# Patient Record
Sex: Female | Born: 1980 | Race: Black or African American | Hispanic: No | Marital: Single | Smoking: Current every day smoker
Health system: Southern US, Community
[De-identification: ages and names within clinical notes are randomized; demographics above are authoritative.]

---

## 2005-03-22 ENCOUNTER — Emergency Department (HOSPITAL_COMMUNITY): Admission: EM | Admit: 2005-03-22 | Discharge: 2005-03-22 | Payer: Self-pay | Admitting: Emergency Medicine

## 2005-11-13 ENCOUNTER — Emergency Department (HOSPITAL_COMMUNITY): Admission: EM | Admit: 2005-11-13 | Discharge: 2005-11-13 | Payer: Self-pay | Admitting: Emergency Medicine

## 2005-11-14 ENCOUNTER — Inpatient Hospital Stay (HOSPITAL_COMMUNITY): Admission: EM | Admit: 2005-11-14 | Discharge: 2005-11-15 | Payer: Self-pay | Admitting: Urology

## 2007-02-03 IMAGING — CT CT PELVIS W/O CM
2 of 4 series · 14 of 32 positions shown, 19 images · IV contrast (agent unspecified)
Comparison: none

CLINICAL DATA: Right-sided abdominal pain extending into the right lower quadrant.  Hematuria.  
 ABDOMEN CT WITHOUT CONTRAST:
TECHNIQUE: Multidetector CT imaging of the abdomen was performed following the standard protocol without IV contrast.  
 The scan demonstrates mild right hydronephrosis.  The right ureter is dilated into the pelvis.  There are no renal calculi.  The liver, spleen, pancreas, and adrenal glands appear normal.  No dilated loops of bowel or adenopathy.
TECHNIQUE: Multidetector CT imaging of the pelvis was performed following the standard protocol without IV contrast. 
 There is a 4.3 mm calcification in the right side of the pelvis consistent with a distal right ureteral stone.  The ureter is dilated to the level of the stone.  The stone is 2 to 3 cm proximal to the ureterovesical junction.  The appendix is visible and appears normal.  Uterus is not enlarged.  The ovaries are only faintly defined.

[Series 2: routine abdomen · axial · 0.73mm/px · z∈[-433,-133]mm · 6 of 86 slices shown, 11 images]
[im 13/86  soft-tissue]
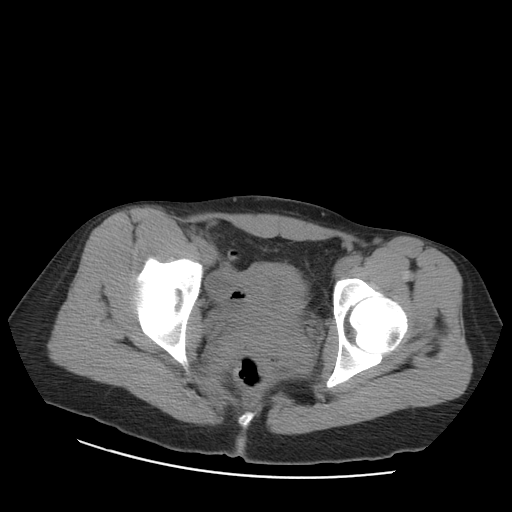
[im 13/86  bone]
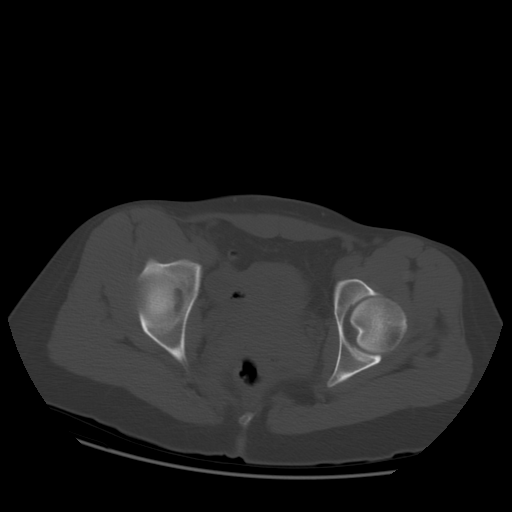
[im 25/86  soft-tissue]
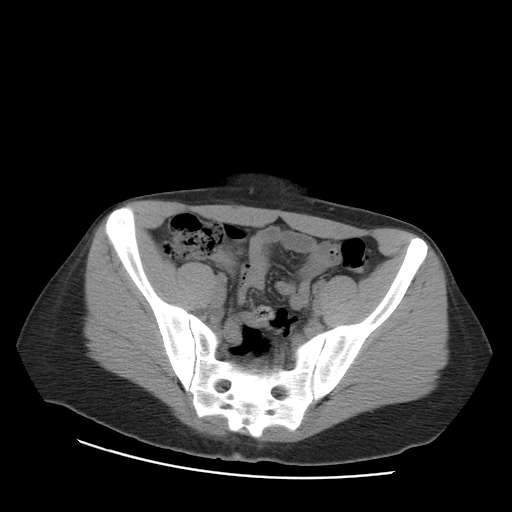
[im 37/86  soft-tissue]
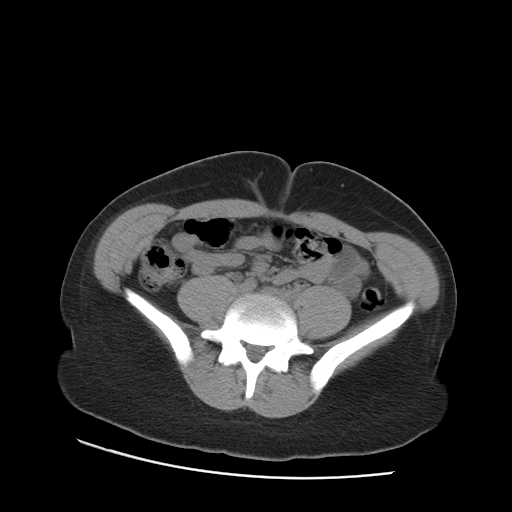
[im 37/86  lung]
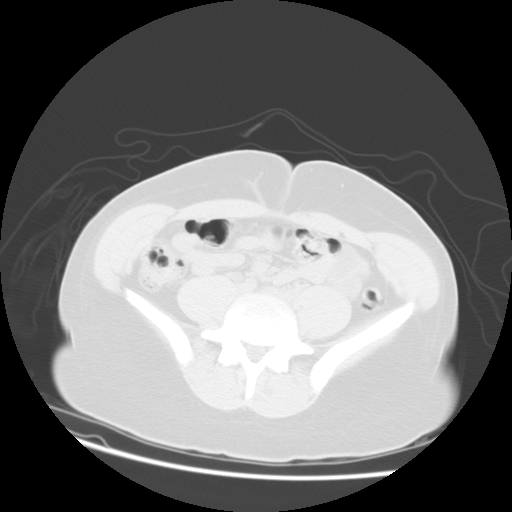
[im 49/86  soft-tissue]
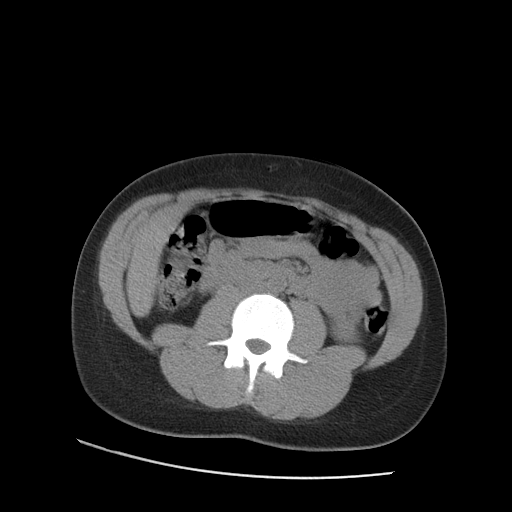
[im 49/86  lung]
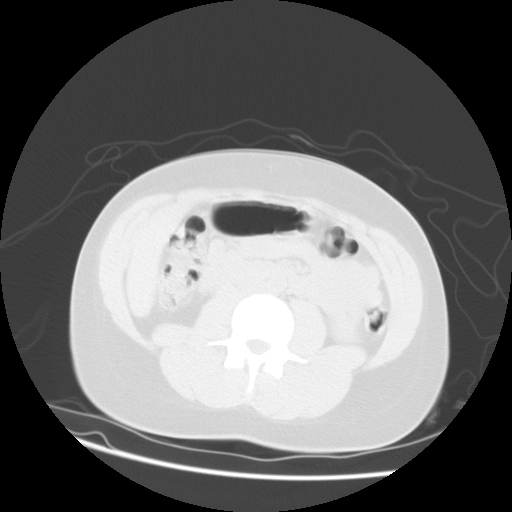
[im 61/86  soft-tissue]
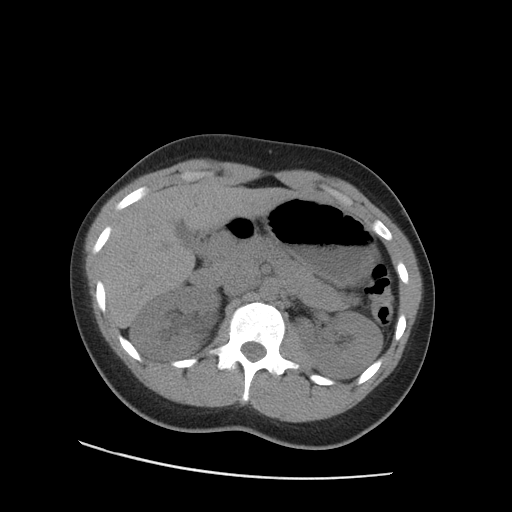
[im 61/86  lung]
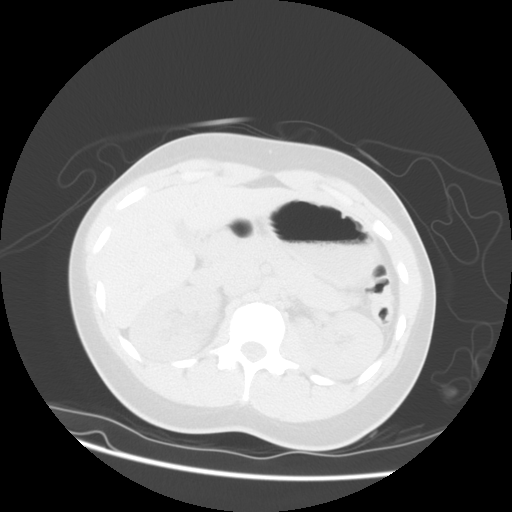
[im 73/86  soft-tissue]
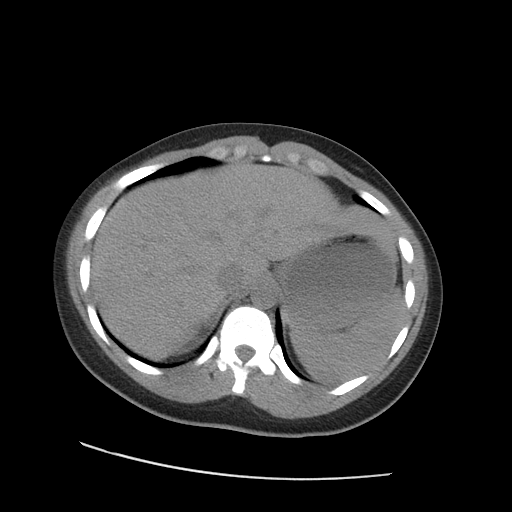
[im 73/86  lung]
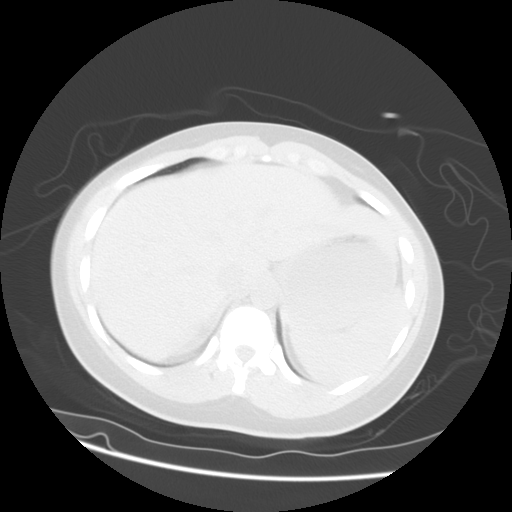

[Series 103: reformatted · sagittal · 0.88mm/px · 8 of 133 slices shown]
[im 13/133  soft-tissue]
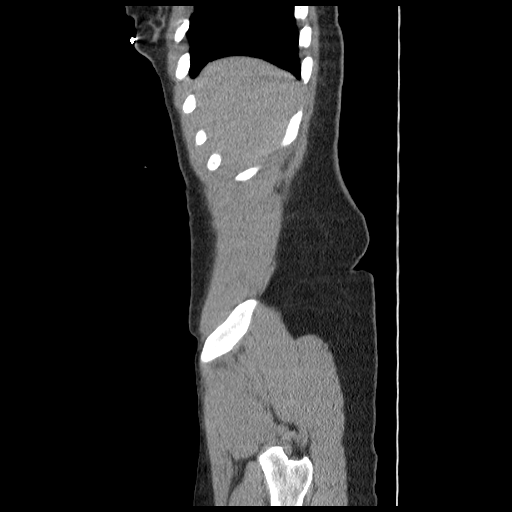
[im 25/133  soft-tissue]
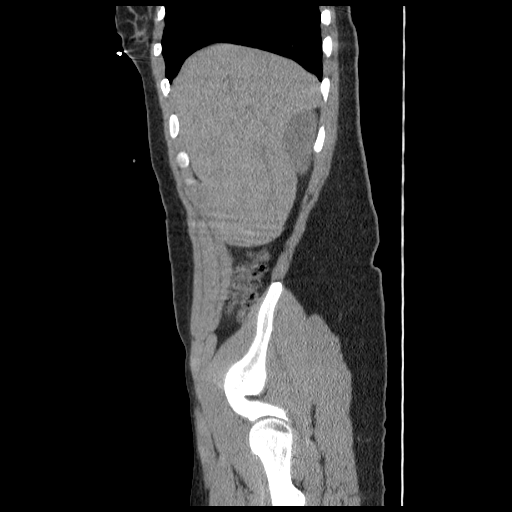
[im 49/133  soft-tissue]
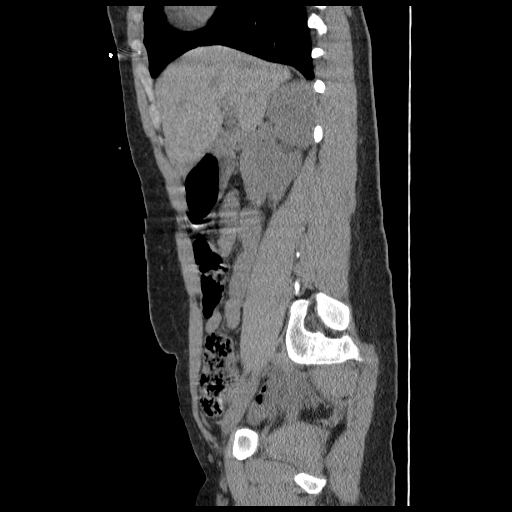
[im 61/133  soft-tissue]
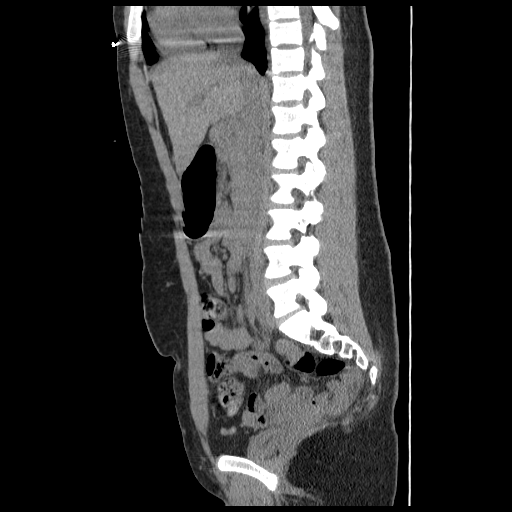
[im 73/133  soft-tissue]
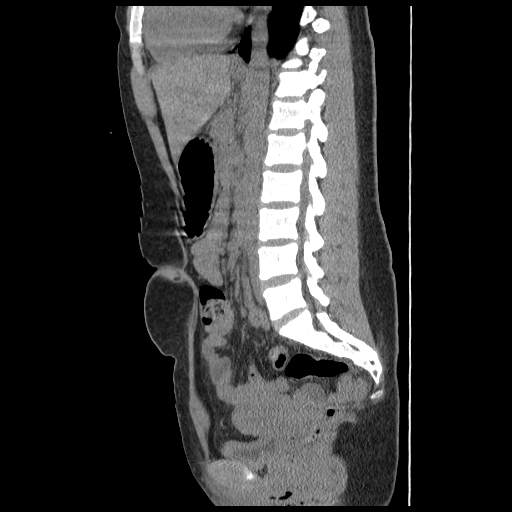
[im 85/133  soft-tissue]
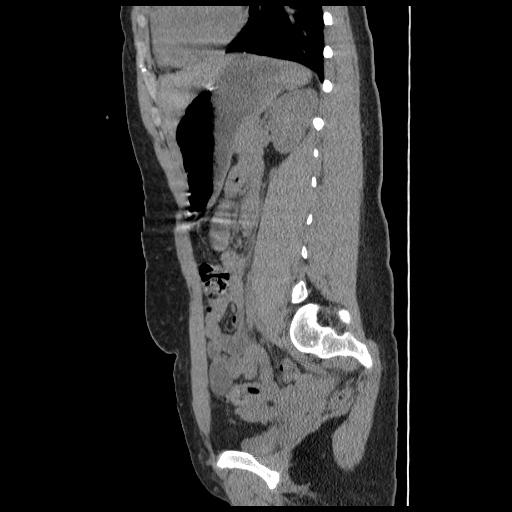
[im 109/133  soft-tissue]
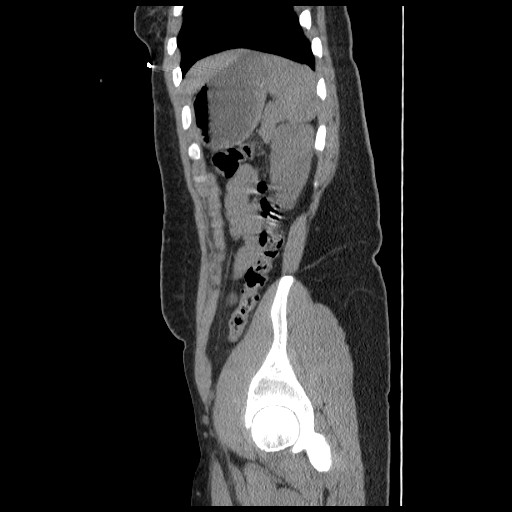
[im 121/133  soft-tissue]
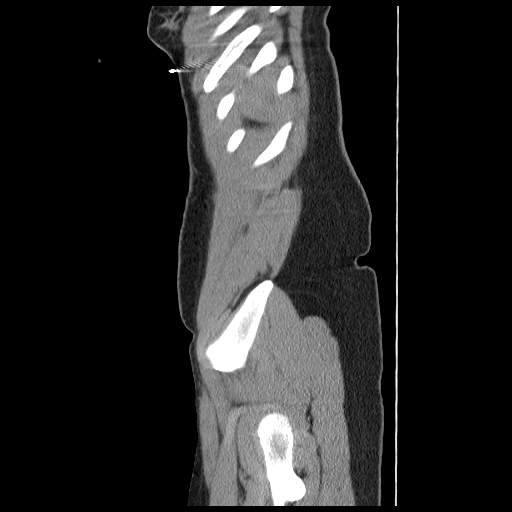

[14 of 32 positions shown; findings below may reference images not displayed]

IMPRESSION: Mild right hydronephrosis.  
 PELVIS CT WITHOUT CONTRAST:
IMPRESSION: 1.  4.3 mm stone in the distal right ureter causing mild right hydronephrosis. 
 2.  The stone is visible on the scout radiograph and if required, a followup could be performed with plain film exam.

## 2008-04-01 ENCOUNTER — Emergency Department (HOSPITAL_COMMUNITY): Admission: EM | Admit: 2008-04-01 | Discharge: 2008-04-01 | Payer: Self-pay | Admitting: Emergency Medicine

## 2011-04-04 NOTE — Op Note (Signed)
Tasha Hancock, Tasha Hancock                  ACCOUNT NO.:  [id number]   MEDICAL RECORD NO.:  [id number]          PATIENT TYPE:  INP   LOCATION:  1505                         FACILITY:  [facility]   PHYSICIAN:  Lindaann Slough, M.D.  DATE OF BIRTH:  [birth year]   DATE OF PROCEDURE:  11/15/2005  DATE OF DISCHARGE:  11/15/2005                                 OPERATIVE REPORT   PREOPERATIVE DIAGNOSIS:  Right ureteral stone.   POSTOPERATIVE DIAGNOSIS:  Right ureteral stone.   PROCEDURE:  Cystoscopy, right retrograde pyelogram,  ureteroscopy, holmium  laser, right ureteral stone extraction and insertion of double-J catheter.   SURGEON:  Danae Chen, M.D.   ANESTHESIA:  General.   INDICATION:  The patient is a 30 year old female who has been complaining of  severe right flank pain associated with nausea and vomiting.  She has been  having difficulty voiding. A CT scan of the abdomen and pelvis showed a 5-mm  stone in the right distal ureter.  She was treated with analgesics and did  not have any relief.  She is scheduled for stone manipulation.   DESCRIPTION OF PROCEDURE:  Under general anesthesia, the patient was prepped  and draped and placed in the dorsal lithotomy position. A #22 Wappler  cystoscope was inserted into the bladder. The bladder mucosa is normal.  There is no stone or tumor in the bladder. The ureteral orifices are in  normal position and shape.   RETROGRADE PYELOGRAM:   A cone-tipcatheter was passed through the cystoscope into the right ureteral  orifice. Contrast was injected through the cone-tip catheter. There was a  filling defect in the distal ureter.  The proximal and mid ureter were  moderately dilated. The renal pelvis and calices were normal.  The cone-  tipcatheter was then removed.   A sensor guidewire was then passed through the cystoscope into the right  ureter, and the cystoscope was removed.  A #6.5 French rigid ureteroscope  was then passed in the bladder and  into the ureter, but the scope could not  be advanced beyond the intramural ureter.  The ureteroscope was then  removed.  The inner sheath of the ureteroscope access sheath was passed over  the guidewire, and the intramural ureter was then dilated.  The ureteroscope  access sheath was then was then removed.  The ureteroscope was then passed  in the bladder and in the ureter and advanced into the distal ureter. There  was a stone in the distal ureter.  With the holmium laser using a 200  microfiber, the stone was broken in small fragments. Then with the nitinol  basket, the fragments were extracted and dropped in the bladder.   Retrograde pyelogram at the end of the procedure:   It shows no evidence of extravasation of contrast and no evidence of filling  defect in the ureter.  The ureteroscope was then removed.   The guidewire was then back loaded into the cystoscope, and a #6-24 double-J  catheter was passed over the guidewire.  The proximal curl of the double-J  catheter was in the  renal pelvis, and the distal curl was in the bladder.  The guidewire was then removed.  The bladder was then irrigated with normal  saline, and the stone fragments were irrigated out of the bladder.  The  bladder was then emptied and the cystoscope removed.   The patient tolerated the procedure well and left the OR in satisfactory  condition to the post anesthesia care unit.      Lindaann Slough, M.D.  Electronically Signed     MN/MEDQ  D:  11/15/2005  T:  11/17/2005  Job:  [id number]   cc:   Windy Fast L. Earlene Plater, M.D.  Fax: [phone]

## 2011-04-04 NOTE — Discharge Summary (Signed)
Tasha Hancock, Tasha Hancock                  ACCOUNT NO.:  [id number]   MEDICAL RECORD NO.:  [id number]          PATIENT TYPE:  INP   LOCATION:  1505                         FACILITY:  [facility]   PHYSICIAN:  Lindaann Slough, M.D.  DATE OF BIRTH:  06/22/1981   DATE OF ADMISSION:  11/14/2005  DATE OF DISCHARGE:  11/15/2005                                 DISCHARGE SUMMARY   DISCHARGE DIAGNOSIS:  Right ureteral stone.   PROCEDURES:  1.  Cystoscopy.  2.  Right retrograde pyelogram.  3.  Ureteroscopy with stone extraction.  4.  Insertion of double J on November 15, 2005.   HISTORY OF PRESENT ILLNESS:  Patient is a 30 year old female who was  admitted on November 14, 2005 with a two day history of severe right flank  pain associated with nausea, vomiting and difficulty voiding.  CT scan of  the abdomen and pelvis showed a 5 mm stone in the right distal ureter with  hydronephrosis.  She was then admitted for further treatment.   PHYSICAL EXAMINATION:  VITAL SIGNS: Blood pressure was 133/91, pulse 78,  respirations 20, temperature 97.9.  LUNGS: Clear.  HEART: Regular rate and rhythm.  ABDOMEN: Soft, tender in right flank.  She had right CVA tenderness.  Kidneys not palpable.   ADMISSION LABORATORY DATA:  Hemoglobin on November 13, 2005 was 11.8,  hematocrit was 34.3, WBC 9.6, sodium of 138, potassium 3.7, BUN 8,  creatinine 0.7. Urinalysis showed too numerous to count RBC's and 0-2 WBC's,  few bacteria.   HOSPITAL COURSE:  Patient was treated with morphine PCA and on November 15, 2005 she had a cystoscopy and stone extraction done and insertion of double  J.  She did not have any more pain.  She was voiding well.  Her urine was  slightly bloody.  She was then discharged  home on November 15, 2005 on  ibuprofen 800 mg p.r.n.   DISCHARGE CONDITION:  Good.   DISCHARGE DIET:  Regular.   DISCHARGE INSTRUCTIONS:  She was advised to drink lots of fluid and she will  return to the office next week  for removal of the double J.      Lindaann Slough, M.D.  Electronically Signed     MN/MEDQ  D:  11/15/2005  T:  11/17/2005  Job:  [id number]

## 2012-03-19 ENCOUNTER — Encounter (HOSPITAL_COMMUNITY): Payer: Self-pay

## 2012-03-19 ENCOUNTER — Emergency Department (HOSPITAL_COMMUNITY)
Admission: EM | Admit: 2012-03-19 | Discharge: 2012-03-19 | Disposition: A | Discharge status: Home / Self Care | Payer: No Typology Code available for payment source | Attending: Emergency Medicine | Admitting: Emergency Medicine

## 2012-03-19 DIAGNOSIS — M79609 Pain in unspecified limb: Secondary | ICD-10-CM | POA: Insufficient documentation

## 2012-03-19 DIAGNOSIS — T148XXA Other injury of unspecified body region, initial encounter: Secondary | ICD-10-CM
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
## 2012-03-21 NOTE — ED Provider Notes (Signed)
Medical screening examination/treatment/procedure(s) were performed by non-physician practitioner and as supervising physician I was immediately available for consultation/collaboration.    Nelia Shi, MD 03/21/12 1126

## 2012-05-21 ENCOUNTER — Emergency Department (HOSPITAL_COMMUNITY)
Admission: EM | Admit: 2012-05-21 | Discharge: 2012-05-21 | Disposition: A | Discharge status: Home / Self Care | Payer: Self-pay | Attending: Emergency Medicine | Admitting: Emergency Medicine

## 2012-05-21 ENCOUNTER — Encounter (HOSPITAL_COMMUNITY): Payer: Self-pay | Admitting: Emergency Medicine

## 2012-05-21 DIAGNOSIS — L500 Allergic urticaria: Secondary | ICD-10-CM | POA: Insufficient documentation

## 2012-05-21 DIAGNOSIS — T7840XA Allergy, unspecified, initial encounter: Secondary | ICD-10-CM

## 2012-05-21 MED ORDER — DIPHENHYDRAMINE HCL 25 MG PO TABS: 50.0000 mg | ORAL_TABLET | ORAL | Status: DC | PRN

## 2012-05-21 MED ORDER — SODIUM CHLORIDE 0.9 % IV BOLUS (SEPSIS)
1000.0000 mL | Freq: Once | INTRAVENOUS | Status: AC
  Administered 2012-05-21: 1000 mL via INTRAVENOUS

## 2012-05-21 MED ORDER — PREDNISONE 20 MG PO TABS: ORAL_TABLET | ORAL | Status: AC

## 2012-05-21 MED ORDER — FAMOTIDINE 20 MG PO TABS: 20.0000 mg | ORAL_TABLET | Freq: Two times a day (BID) | ORAL | Status: DC

## 2012-05-21 MED ORDER — FAMOTIDINE IN NACL 20-0.9 MG/50ML-% IV SOLN
20.0000 mg | Freq: Once | INTRAVENOUS | Status: AC
  Administered 2012-05-21: 20 mg via INTRAVENOUS
  Filled 2012-05-21: qty 50

## 2012-05-21 MED ORDER — DIPHENHYDRAMINE HCL 50 MG/ML IJ SOLN: 50.0000 mg | Freq: Once | INTRAMUSCULAR | Status: DC

## 2012-05-21 MED ORDER — EPINEPHRINE 0.3 MG/0.3ML IJ DEVI
0.3000 mg | Freq: Once | INTRAMUSCULAR | Status: AC
  Administered 2012-05-21: 0.3 mg via INTRAMUSCULAR
  Filled 2012-05-21: qty 0.3

## 2012-05-21 MED ORDER — EPINEPHRINE HCL 0.1 MG/ML IJ SOLN: 0.1000 mg | Freq: Once | INTRAMUSCULAR | Status: DC

## 2012-05-21 MED ORDER — METHYLPREDNISOLONE SODIUM SUCC 125 MG IJ SOLR
250.0000 mg | Freq: Once | INTRAMUSCULAR | Status: AC
  Administered 2012-05-21: 250 mg via INTRAVENOUS
  Filled 2012-05-21: qty 4

## 2012-05-21 MED ORDER — LORATADINE 10 MG PO TABS: 10.0000 mg | ORAL_TABLET | Freq: Every day | ORAL | Status: DC

## 2012-05-21 NOTE — ED Notes (Signed)
Pt states she woke up with itching all over, hives, and sore throat x 30 minutes.   Reports eating shrimp around 5pm and then lying down to take a nap.  Speaking in complete sentences.

## 2012-05-21 NOTE — ED Provider Notes (Signed)
History     CSN: [id number]  Arrival date & time 05/21/12  2016   First MD Initiated Contact with Patient 05/21/12 2059      Chief Complaint  Patient presents with  . Allergic Reaction    (Consider location/radiation/quality/duration/timing/severity/associated sxs/prior treatment) Patient is a 31 y.o. female presenting with allergic reaction. The history is provided by the patient.  Allergic Reaction The primary symptoms are  nausea, rash and urticaria. The primary symptoms do not include wheezing, shortness of breath, cough, abdominal pain, vomiting, diarrhea, dizziness, palpitations, altered mental status or angioedema. The current episode started less than 1 hour ago. The problem has been gradually improving. This is a new problem.  The rash is associated with itching.  The onset of the reaction was associated with eating. Significant symptoms also include flushing and itching. Significant symptoms that are not present include eye redness or rhinorrhea.    History reviewed. No pertinent past medical history.  History reviewed. No pertinent past surgical history.  No family history on file.  History  Substance Use Topics  . Smoking status: Current Everyday Smoker  . Smokeless tobacco: Not on file  . Alcohol Use: No    OB History    Grav Para Term Preterm Abortions TAB SAB Ect Mult Living                  Review of Systems  Constitutional: Negative for fever, chills, diaphoresis and fatigue.  HENT: Negative for ear pain, congestion, sore throat, facial swelling, rhinorrhea, mouth sores, trouble swallowing, neck pain and neck stiffness.   Eyes: Negative.  Negative for redness.  Respiratory: Negative for apnea, cough, chest tightness, shortness of breath and wheezing.   Cardiovascular: Negative for chest pain, palpitations and leg swelling.  Gastrointestinal: Positive for nausea. Negative for vomiting, abdominal pain, diarrhea and abdominal distention.  Genitourinary:  Negative for hematuria, flank pain, vaginal discharge, difficulty urinating and menstrual problem.  Musculoskeletal: Negative for back pain and gait problem.  Skin: Positive for flushing, itching and rash. Negative for wound.  Neurological: Negative for dizziness, tremors, seizures, syncope, facial asymmetry, numbness and headaches.  Psychiatric/Behavioral: Negative.  Negative for altered mental status.  All other systems reviewed and are negative.    Allergies  Shrimp  Home Medications   Current Outpatient Rx  Name Route Sig Dispense Refill  . DIPHENHYDRAMINE HCL 25 MG PO TABS Oral Take 2 tablets (50 mg total) by mouth every 4 (four) hours as needed for itching. 20 tablet 0  . FAMOTIDINE 20 MG PO TABS Oral Take 1 tablet (20 mg total) by mouth 2 (two) times daily. 10 tablet 0  . LORATADINE 10 MG PO TABS Oral Take 1 tablet (10 mg total) by mouth daily. 5 tablet 0  . PREDNISONE 20 MG PO TABS  3 tabs po daily x 3 days 9 tablet 0    BP 117/75  Pulse 82  Temp 97.4 F (36.3 C) (Oral)  Resp 18  SpO2 100%  LMP 05/06/2012  Physical Exam  Nursing note and vitals reviewed. Constitutional: She is oriented to person, place, and time. She appears well-developed and well-nourished. No distress.  HENT:  Head: Normocephalic and atraumatic.  Right Ear: External ear normal.  Left Ear: External ear normal.  Nose: Nose normal.  Mouth/Throat: Oropharynx is clear and moist. No oropharyngeal exudate.  Eyes: Conjunctivae and EOM are normal. Pupils are equal, round, and reactive to light. Right eye exhibits no discharge. Left eye exhibits no discharge.  Neck: Normal  range of motion. Neck supple. No JVD present. No tracheal deviation present. No thyromegaly present.  Cardiovascular: Normal rate, regular rhythm, normal heart sounds and intact distal pulses.  Exam reveals no gallop and no friction rub.   No murmur heard. Pulmonary/Chest: Effort normal and breath sounds normal. No respiratory distress.  She has no wheezes. She has no rales. She exhibits no tenderness.  Abdominal: Soft. Bowel sounds are normal. She exhibits no distension. There is no tenderness. There is no rebound and no guarding.  Musculoskeletal: Normal range of motion.  Lymphadenopathy:    She has no cervical adenopathy.  Neurological: She is alert and oriented to person, place, and time. No cranial nerve deficit. Coordination normal.  Skin: Skin is warm. Rash noted. Rash is urticarial. She is not diaphoretic.       Urticarial rash that involves the arms and legs and abdomen. Wheals seen.  Psychiatric: She has a normal mood and affect. Her behavior is normal. Judgment and thought content normal.    ED Course  Procedures (including critical care time)  Labs Reviewed - No data to display No results found.   1. Allergic reaction       MDM  31 year old female patient with noncontributory past medical history presents complaining of itchy urticarial type rashes that involve her arms and legs and abdomen that started soon after eating shrimp earlier today. Patient says she's eaten shrimp and seafood before with no reaction so this is new. Patient also complaining of nausea and abdominal pain. Patient has 2 systems involves therefore is having an anaphylactic reaction. She has not had any airway compromise at this time. She is not short of breath stridorous or having any difficulty breathing. Normal lung sounds heart sounds abdominal exam. Patient given epinephrine ranitidine and Solu-Medrol. She says that she's taken 50 mg of Benadryl and is already feeling better. Patient will be given these medications observed to insure improvement. Patient continues to appear well and is now ready for discharge.  Case discussed with Dr. Burnett Corrente, MD 05/21/12 [id number]

## 2012-05-21 NOTE — ED Notes (Signed)
Pt states she ate shrimp today and took a nap and woke up covered in hives. Pt states she had a sore throat. Pt appears very anxious and is scratching self all over. Pt states she had hives all over her neck and face. Hives noted to face arms and legs. Pt states she took 4-5 benadryl at home. Pt airway is clear, throat does not appear swollen. Pt speaking in complete sentences, no resp distress noted to pt.

## 2012-05-21 NOTE — ED Notes (Signed)
Pt states "My throat is kind of scratchy or something." Denies any pain. Pt has visibly calmed down. Is laying in bed, joking with staff. Is no longer continuously scratching self. ABC intact. NAD.

## 2012-05-21 NOTE — ED Notes (Signed)
Pt states she is feeling better. Pt hives have decreased and pt states "I'm a little itchy but not as bad."

## 2012-05-21 NOTE — ED Provider Notes (Signed)
I saw and evaluated the patient, reviewed the resident's note and I agree with the findings and plan.  Subjective throat tightness without stridor or airway compromise, L:CTA, has general hives/pruritis.  Hurman Horn, MD 05/22/12 1340

## 2012-05-21 NOTE — ED Notes (Signed)
Pt d/c home in NAD. ABC intact. Pt states she is feeling better. Pt ambulated to d/c with quick, steady gait.

## 2012-08-25 ENCOUNTER — Emergency Department (HOSPITAL_COMMUNITY)
Admission: EM | Admit: 2012-08-25 | Discharge: 2012-08-26 | Disposition: A | Discharge status: Home / Self Care | Payer: Self-pay | Attending: Emergency Medicine | Admitting: Emergency Medicine

## 2012-08-25 ENCOUNTER — Encounter (HOSPITAL_COMMUNITY): Payer: Self-pay | Admitting: *Deleted

## 2012-08-25 DIAGNOSIS — Z91013 Allergy to seafood: Secondary | ICD-10-CM | POA: Insufficient documentation

## 2012-08-25 DIAGNOSIS — X58XXXA Exposure to other specified factors, initial encounter: Secondary | ICD-10-CM | POA: Insufficient documentation

## 2012-08-25 DIAGNOSIS — T7840XA Allergy, unspecified, initial encounter: Secondary | ICD-10-CM | POA: Insufficient documentation

## 2012-08-25 DIAGNOSIS — F17200 Nicotine dependence, unspecified, uncomplicated: Secondary | ICD-10-CM | POA: Insufficient documentation

## 2012-08-25 MED ORDER — EPINEPHRINE 0.3 MG/0.3ML IJ DEVI: 0.3000 mg | INTRAMUSCULAR | Status: DC | PRN

## 2012-08-25 MED ORDER — PREDNISONE 20 MG PO TABS
60.0000 mg | ORAL_TABLET | Freq: Every day | ORAL | Status: DC
  Administered 2012-08-25: 60 mg via ORAL
  Filled 2012-08-25: qty 3

## 2012-08-25 MED ORDER — DIPHENHYDRAMINE HCL 25 MG PO CAPS
25.0000 mg | ORAL_CAPSULE | Freq: Four times a day (QID) | ORAL | Status: DC | PRN
  Administered 2012-08-25: 25 mg via ORAL
  Filled 2012-08-25: qty 1

## 2012-08-25 MED ORDER — FAMOTIDINE 20 MG PO TABS
20.0000 mg | ORAL_TABLET | Freq: Two times a day (BID) | ORAL | Status: DC
  Administered 2012-08-25: 20 mg via ORAL
  Filled 2012-08-25: qty 1

## 2012-08-25 MED ORDER — EPINEPHRINE 0.3 MG/0.3ML IJ DEVI
0.3000 mg | Freq: Once | INTRAMUSCULAR | Status: AC
  Administered 2012-08-25: 0.3 mg via INTRAMUSCULAR
  Filled 2012-08-25: qty 0.3

## 2012-08-25 NOTE — ED Provider Notes (Signed)
History     CSN: [id number]  Arrival date & time 08/25/12  2128   First MD Initiated Contact with Patient 08/25/12 2214      Chief Complaint  Patient presents with  . Rash    (Consider location/radiation/quality/duration/timing/severity/associated sxs/prior treatment) HPI Comments: Patient is a 31 year old female with no significant past medical history that presents emergency department complaining of rash.  Rash is located all over patient's back under breasts on posterior aspect of thighs and is described to be extremely pruritic in nature. Onset of rash began acutely a couple of hours ago.  Patient states that she's had similar breakouts in the past that were treated as allergies with prednisone.  Patient denies other housemates with similar symptoms, fever, night sweats, chills, wheezing, shortness of breath, throat closing, inability to swallow secretions or any stridor. Patient states she has allergies to shellfish but no known other allergies.  Patient denies use of new medications or products.  The history is provided by the patient.    History reviewed. No pertinent past medical history.  History reviewed. No pertinent past surgical history.  History reviewed. No pertinent family history.  History  Substance Use Topics  . Smoking status: Current Every Day Smoker    Types: Cigarettes  . Smokeless tobacco: Not on file  . Alcohol Use: No    OB History    Grav Para Term Preterm Abortions TAB SAB Ect Mult Living                  Review of Systems  Constitutional: Negative for fever, chills and diaphoresis.  HENT: Negative for sore throat, rhinorrhea, drooling, trouble swallowing, neck stiffness, voice change and sinus pressure.   Eyes: Negative for visual disturbance.  Respiratory: Negative for cough, choking, chest tightness, shortness of breath, wheezing and stridor.   Cardiovascular: Negative for chest pain and palpitations.  Gastrointestinal: Negative for  abdominal pain.  Genitourinary: Negative for dysuria.  Musculoskeletal: Negative for back pain and gait problem.  Skin: Positive for rash.  Neurological: Negative for syncope, speech difficulty, light-headedness and headaches.  Psychiatric/Behavioral: Negative for confusion.  All other systems reviewed and are negative.    Allergies  Shrimp  Home Medications   Current Outpatient Rx  Name Route Sig Dispense Refill  . DIPHENHYDRAMINE HCL 25 MG PO TABS Oral Take 25 mg by mouth every 6 (six) hours as needed. Allergies.    Marland Kitchen DIPHENHYDRAMINE HCL 25 MG PO TABS Oral Take 2 tablets (50 mg total) by mouth every 4 (four) hours as needed for itching. 20 tablet 0  . PREDNISONE PO Oral Take by mouth 3 (three) times daily.      BP 134/87  Pulse 89  Temp 98.1 F (36.7 C) (Axillary)  Resp 18  Ht 5\' 5"  (1.651 m)  Wt 167 lb (75.751 kg)  BMI 27.79 kg/m2  SpO2 94%  LMP 08/11/2012  Physical Exam  Nursing note and vitals reviewed. Constitutional: She is oriented to person, place, and time. She appears well-developed and well-nourished. No distress.  HENT:  Head: Normocephalic and atraumatic.  Mouth/Throat: Oropharynx is clear and moist and mucous membranes are normal.       No sign of airway obstruction. No edema of face, eyelids, lips, tongue, uvula.Marland Kitchen Uvula midline, no nasal congestion or drooling.  Tongue not elevated. No trismus.  Neck: Trachea normal, normal range of motion and full passive range of motion without pain. Neck supple. Carotid bruit is not present. No tracheal deviation present.  No carotid bruits or stridor  Cardiovascular: Normal rate, regular rhythm, intact distal pulses and normal pulses.        Not tachycardic  Pulmonary/Chest: Effort normal. No stridor.  Musculoskeletal: Normal range of motion.  Neurological: She is alert and oriented to person, place, and time.  Skin: Skin is warm and intact. Rash noted. Rash is urticarial. She is not diaphoretic.       Not  diaphoretic. Raised erythematous welts, pruritic in nature, located over majority of skin surface area including trunk, breast, back thighs and arms. Blanchable urticaria, no petechiae or purpura.   Psychiatric: She has a normal mood and affect. Her behavior is normal.    ED Course  Procedures (including critical care time)  Labs Reviewed - No data to display No results found.   No diagnosis found.  Pt with moderate to severe urticarial allergic reaction.  There is no evidence of anaphylaxis on physical exam.  Epinephrine given on patient's request and to treat extreme pruritic urticaria.  MDM  Allergic reaction  Patient re-evaluated prior to dc, is hemodynamically stable, in no respiratory distress, and denies the feeling of throat closing. Pt has been advised to take OTC benadryl & return to the ED if they have a mod-severe allergic rxn (s/s including throat closing, difficulty breathing, swelling of lips face or tongue). Pt will be given epi pen script. Pt is to follow up with their PCP. Pt is agreeable with plan & verbalizes understanding.         Jaci Carrel, [place] 08/25/12 2353

## 2012-08-25 NOTE — ED Notes (Addendum)
Pt noticed red itchy rash to chest, and abdomen Saturday after eating zaxbys. Pts airway intact. Rash noted to chest. Multiple small red raised areas noted to chest. Pt denies new changes in laundry or bath soap.

## 2012-08-26 MED ORDER — PREDNISONE 20 MG PO TABS: 60.0000 mg | ORAL_TABLET | Freq: Every day | ORAL | Status: DC

## 2012-08-26 NOTE — ED Provider Notes (Signed)
Medical screening examination/treatment/procedure(s) were performed by non-physician practitioner and as supervising physician I was immediately available for consultation/collaboration.   Shelda Jakes, MD 08/26/12 [phone]

## 2012-08-27 ENCOUNTER — Emergency Department (HOSPITAL_COMMUNITY)
Admission: EM | Admit: 2012-08-27 | Discharge: 2012-08-27 | Disposition: A | Discharge status: Home / Self Care | Payer: Self-pay | Attending: Emergency Medicine | Admitting: Emergency Medicine

## 2012-08-27 ENCOUNTER — Encounter (HOSPITAL_COMMUNITY): Payer: Self-pay | Admitting: *Deleted

## 2012-08-27 DIAGNOSIS — Z91013 Allergy to seafood: Secondary | ICD-10-CM | POA: Insufficient documentation

## 2012-08-27 DIAGNOSIS — F17200 Nicotine dependence, unspecified, uncomplicated: Secondary | ICD-10-CM | POA: Insufficient documentation

## 2012-08-27 DIAGNOSIS — L509 Urticaria, unspecified: Secondary | ICD-10-CM

## 2012-08-27 MED ORDER — HYDROXYZINE HCL 25 MG PO TABS: 25.0000 mg | ORAL_TABLET | Freq: Four times a day (QID) | ORAL | Status: DC | PRN

## 2012-08-27 MED ORDER — METHYLPREDNISOLONE SODIUM SUCC 125 MG IJ SOLR
125.0000 mg | Freq: Once | INTRAMUSCULAR | Status: AC
  Administered 2012-08-27: 125 mg via INTRAVENOUS
  Filled 2012-08-27: qty 2

## 2012-08-27 MED ORDER — DIPHENHYDRAMINE HCL 50 MG/ML IJ SOLN
50.0000 mg | Freq: Once | INTRAMUSCULAR | Status: AC
  Administered 2012-08-27: 50 mg via INTRAVENOUS
  Filled 2012-08-27: qty 1

## 2012-08-27 MED ORDER — PREDNISONE 20 MG PO TABS: 40.0000 mg | ORAL_TABLET | Freq: Every day | ORAL | Status: DC

## 2012-08-27 NOTE — ED Notes (Signed)
Pt has had increasing hives since Saturday.  Pt denies any change in food, lotion, soap or detergent.  Pt was seen for this at [facility] on Wednesday and put on prednisone.  No relief.  No sob with this

## 2012-08-27 NOTE — Discharge Instructions (Signed)
 Hives Hives are itchy, red, swollen areas of the skin. They can vary in size and location on your body. Hives can come and go for hours or several days (acute hives) or for several weeks (chronic hives). Hives do not spread from person to person (noncontagious). They may get worse with scratching, exercise, and emotional stress. CAUSES   Allergic reaction to food, additives, or drugs.  Infections, including the common cold.  Illness, such as vasculitis, lupus, or thyroid  disease.  Exposure to sunlight, heat, or cold.  Exercise.  Stress.  Contact with chemicals. SYMPTOMS   Red or white swollen patches on the skin. The patches may change size, shape, and location quickly and repeatedly.  Itching.  Swelling of the hands, feet, and face. This may occur if hives develop deeper in the skin. DIAGNOSIS  Your caregiver can usually tell what is wrong by performing a physical exam. Skin or blood tests may also be done to determine the cause of your hives. In some cases, the cause cannot be determined. TREATMENT  Mild cases usually get better with medicines such as antihistamines. Severe cases may require an emergency epinephrine  injection. If the cause of your hives is known, treatment includes avoiding that trigger.  HOME CARE INSTRUCTIONS   Avoid causes that trigger your hives.  Take antihistamines as directed by your caregiver to reduce the severity of your hives. Non-sedating or low-sedating antihistamines are usually recommended. Do not drive while taking an antihistamine.  Take any other medicines prescribed for itching as directed by your caregiver.  Wear loose-fitting clothing.  Keep all follow-up appointments as directed by your caregiver. SEEK MEDICAL CARE IF:   You have persistent or severe itching that is not relieved with medicine.  You have painful or swollen joints. SEEK IMMEDIATE MEDICAL CARE IF:   You have a fever.  Your tongue or lips are swollen.  You have  trouble breathing or swallowing.  You feel tightness in the throat or chest.  You have abdominal pain. These problems may be the first sign of a life-threatening allergic reaction. Call your local emergency services (911 in [place] MAKE SURE YOU:   Understand these instructions.  Will watch your condition.  Will get help right away if you are not doing well or get worse. Document Released: 11/03/2005 Document Revised: 05/04/2012 Document Reviewed: 01/27/2012 [facility] Patient Information 2013 [place], [place].     AFTER YOU COMPLETE THE NEXT 3 DAYS OF THE 60 MG PREDNISONE , PROCEED AND FINISH OFF 6 DAYS OF 40 MG PREDNISONE  TO COMPLETE THE COURSE

## 2012-08-27 NOTE — ED Provider Notes (Signed)
History     CSN: [id number]  Arrival date & time 08/27/12  1191   First MD Initiated Contact with Patient 08/27/12 [phone]      Chief Complaint  Patient presents with  . Urticaria    (Consider location/radiation/quality/duration/timing/severity/associated sxs/prior treatment) HPI Comments: Patient reports that she developed some diffuse itching and hives starting at her scalp and face and involving her upper torso 3 days ago. She was seen it was a long emergency department and given steroids, Pepcid and Benadryl. She did have improvement. She denied any throat swelling or shortness of breath. She managed to get her prescriptions filled yesterday and took 60 mg of prednisone yesterday and again today. In the meantime she took 2 tablets of Benadryl yesterday and once again last night with no significant improvement. She reports nighttime seems to be worse than the time. She now has urticaria and hives along both of her lower legs, buttocks, torso and shoulders. She again denies any shortness of breath, wheezing or stridor. She reports that she's been told that she was allergic to shrimp many years ago but otherwise no known allergies. She is otherwise healthy. She reports she did use a new cocoa butter but this was more than a week ago. She also bought some new wood slats for her bed but this occurred approximately weeks ago. Patient returns because the urticaria is not improving and she reports that she is miserable at nighttime.  Patient is a 31 y.o. female presenting with urticaria. The history is provided by the patient.  Urticaria Pertinent negatives include no chest pain and no shortness of breath.    History reviewed. No pertinent past medical history.  History reviewed. No pertinent past surgical history.  History reviewed. No pertinent family history.  History  Substance Use Topics  . Smoking status: Current Every Day Smoker    Types: Cigarettes  . Smokeless tobacco: Not on file    . Alcohol Use: No    OB History    Grav Para Term Preterm Abortions TAB SAB Ect Mult Living                  Review of Systems  HENT: Negative for sore throat, trouble swallowing and voice change.   Respiratory: Negative for cough and shortness of breath.   Cardiovascular: Negative for chest pain.  Gastrointestinal: Negative for nausea, vomiting and diarrhea.  Skin: Positive for rash.  All other systems reviewed and are negative.    Allergies  Shrimp  Home Medications   Current Outpatient Rx  Name Route Sig Dispense Refill  . DIPHENHYDRAMINE HCL 25 MG PO TABS Oral Take 25 mg by mouth every 6 (six) hours as needed. Allergies.    Marland Kitchen PREDNISONE 10 MG PO TABS Oral Take 60 mg by mouth daily.      BP 122/81  Pulse 107  Temp 97.6 F (36.4 C) (Oral)  Resp 20  SpO2 96%  LMP 08/11/2012  Physical Exam  Nursing note and vitals reviewed. Constitutional: She appears well-developed and well-nourished.  HENT:  Head: Normocephalic and atraumatic.  Eyes: Pupils are equal, round, and reactive to light.  Neck: Normal range of motion and phonation normal. Neck supple.  Cardiovascular: Regular rhythm.   Pulmonary/Chest: Effort normal. No stridor. No respiratory distress. She has no wheezes.  Abdominal: Soft. She exhibits no distension. There is no tenderness.  Musculoskeletal: Normal range of motion.  Neurological: She is alert.  Skin: Skin is warm, dry and intact. Rash noted. Rash is  urticarial. No cyanosis. No pallor.  Psychiatric: She has a normal mood and affect.    ED Course  Procedures (including critical care time)  Labs Reviewed - No data to display No results found.   1. Urticaria     ra sat is 96% and in interpret to be normal.  12:55 PM Pt feels improved, hives seem to have receded.  No airway compromise.  Will d/c home.  Pt knows to return if worse, will give longer course of steroids and will refer to allergy specialist.  Atarax.    MDM  Pt with diffuse  urticaria, no airway compromise. Will give IV dose of solumedrol and IV benadryl.  Pt given 30 tablets of 10 mg prednisone to take for 5 days.  I think pt will need longer course.  Pt did have a 1 day lapse in prednisone which may account for minor relapse.  Will need referral to allergist.  Pt did get epipen for home as well which she has not yet used.          Gavin Pound. Shaquan Puerta, MD 08/27/12 1258

## 2017-04-13 ENCOUNTER — Encounter: Payer: Self-pay | Admitting: *Deleted

## 2017-04-13 ENCOUNTER — Emergency Department
Admission: EM | Admit: 2017-04-13 | Discharge: 2017-04-13 | Disposition: A | Discharge status: Home / Self Care | Payer: Managed Care, Other (non HMO) | Attending: Emergency Medicine | Admitting: Emergency Medicine

## 2017-04-13 DIAGNOSIS — F17210 Nicotine dependence, cigarettes, uncomplicated: Secondary | ICD-10-CM | POA: Diagnosis not present

## 2017-04-13 DIAGNOSIS — T7840XA Allergy, unspecified, initial encounter: Secondary | ICD-10-CM | POA: Diagnosis not present

## 2017-04-13 DIAGNOSIS — Z79899 Other long term (current) drug therapy: Secondary | ICD-10-CM | POA: Diagnosis not present

## 2017-04-13 DIAGNOSIS — L509 Urticaria, unspecified: Secondary | ICD-10-CM | POA: Diagnosis present

## 2017-04-13 MED ORDER — FAMOTIDINE 40 MG PO TABS: 40.0000 mg | ORAL_TABLET | Freq: Every day | ORAL | 1 refills | Status: DC

## 2017-04-13 MED ORDER — PREDNISONE 10 MG (21) PO TBPK: ORAL_TABLET | Freq: Every day | ORAL | 0 refills | Status: AC

## 2017-04-13 MED ORDER — FAMOTIDINE 20 MG PO TABS
40.0000 mg | ORAL_TABLET | Freq: Once | ORAL | Status: AC
  Administered 2017-04-13: 40 mg via ORAL
  Filled 2017-04-13: qty 2

## 2017-04-13 MED ORDER — METHYLPREDNISOLONE SODIUM SUCC 125 MG IJ SOLR
125.0000 mg | Freq: Once | INTRAMUSCULAR | Status: AC
  Administered 2017-04-13: 125 mg via INTRAMUSCULAR
  Filled 2017-04-13: qty 2

## 2017-04-13 NOTE — ED Provider Notes (Signed)
[facility] Emergency Department Provider Note  ____________________________________________  Time seen: Approximately 9:44 PM  I have reviewed the triage vital signs and the nursing notes.   HISTORY  Chief Complaint Urticaria    HPI Anyiah Hendrie is a 36 y.o. female presenting to the emergency department with diffuse, pruritic hives for the past one day. Patient denies new contact exposures with foods, linens or plant materials. Patient denies dysphagia, nausea, vomiting, abdominal pain, chest tightness and shortness of breath. Patient denies history of anaphylaxis. Patient has been taking Benadryl which relieves hives temporarily.   History reviewed. No pertinent past medical history.  There are no active problems to display for this patient.   History reviewed. No pertinent surgical history.  Prior to Admission medications   Medication Sig Start Date End Date Taking? Authorizing Provider  diphenhydrAMINE (BENADRYL) 25 MG tablet Take 25 mg by mouth every 6 (six) hours as needed. Allergies.    [provider]  famotidine (PEPCID) 40 MG tablet Take 1 tablet (40 mg total) by mouth daily. 04/13/17 04/18/17  Orvil Feil, PA-C  hydrOXYzine (ATARAX/VISTARIL) 25 MG tablet Take 1 tablet (25 mg total) by mouth every 6 (six) hours as needed for itching. 08/27/12   Quita Skye, MD  predniSONE (STERAPRED UNI-PAK 21 TAB) 10 MG (21) TBPK tablet Take by mouth daily. Take 6 tablets the first day, take 5 tablets the second day, take 4 tablets the third day, take 3 tablets the fourth day, take 2 tablets the fifth day, take 1 tablet the sixth day. 04/13/17 04/19/17  Orvil Feil, PA-C    Allergies Shrimp [shellfish allergy]  No family history on file.  Social History Social History  Substance Use Topics  . Smoking status: Current Every Day Smoker    Types: Cigarettes  . Smokeless tobacco: Not on file  . Alcohol use No     Review of Systems   Constitutional: No fever/chills Eyes: No visual changes. No discharge ENT: No upper respiratory complaints. Cardiovascular: no chest pain. Respiratory: no cough. No SOB. Gastrointestinal: No abdominal pain.  No nausea, no vomiting.  No diarrhea.  No constipation. Musculoskeletal: Negative for musculoskeletal pain. Skin: Patient has hives.  Neurological: Negative for headaches, focal weakness or numbness.   ____________________________________________   PHYSICAL EXAM:  VITAL SIGNS: ED Triage Vitals  Enc Vitals Group     BP 04/13/17 2012 104/78     Pulse Rate 04/13/17 2012 100     Resp 04/13/17 2012 20     Temp 04/13/17 2012 97.7 F (36.5 C)     Temp Source 04/13/17 2012 Oral     SpO2 04/13/17 2012 100 %     Weight 04/13/17 2010 200 lb (90.7 kg)     Height 04/13/17 2010 5\' 5"  (1.651 m)     Head Circumference --      Peak Flow --      Pain Score --      Pain Loc --      Pain Edu? --      Excl. in GC? --      Constitutional: Alert and oriented. Well appearing and in no acute distress. Eyes: Conjunctivae are normal. PERRL. EOMI. Head: Atraumatic. ENT:      Ears: Tympanic membranes are pearly bilaterally.      Nose: No congestion/rhinnorhea.      Mouth/Throat: Mucous membranes are moist.  Hematological/Lymphatic/Immunilogical: No cervical lymphadenopathy.  Cardiovascular: Normal rate, regular rhythm. Normal S1 and S2.  Good peripheral circulation.  Respiratory: Normal respiratory effort without tachypnea or retractions. Lungs CTAB. Good air entry to the bases with no decreased or absent breath sounds. Gastrointestinal: Bowel sounds 4 quadrants. Soft and nontender to palpation. No guarding or rigidity. No palpable masses. No distention. No CVA tenderness. Musculoskeletal: Full range of motion to all extremities. No gross deformities appreciated. Neurologic:  Normal speech and language. No gross focal neurologic deficits are appreciated.  Skin: Patient has diffuse  hives. Psychiatric: Mood and affect are normal. Speech and behavior are normal. Patient exhibits appropriate insight and judgement.   ____________________________________________   LABS (all labs ordered are listed, but only abnormal results are displayed)  Labs Reviewed - No data to display ____________________________________________  EKG   ____________________________________________  RADIOLOGY  No results found.  ____________________________________________    PROCEDURES  Procedure(s) performed:    Procedures    Medications  methylPREDNISolone sodium succinate (SOLU-MEDROL) 125 mg/2 mL injection 125 mg (125 mg Intramuscular Given 04/13/17 2145)  famotidine (PEPCID) tablet 40 mg (40 mg Oral Given 04/13/17 2145)     ____________________________________________   INITIAL IMPRESSION / ASSESSMENT AND PLAN / ED COURSE  Pertinent labs & imaging results that were available during my care of the patient were reviewed by me and considered in my medical decision making (see chart for details).  Review of the [place] CSRS was performed in accordance of the [facility] prior to dispensing any controlled drugs.     Assessment and plan: Allergic Reaction  Patient presents to the emergency department with diffuse, pruritic hives for one day. Patient was given an injection of Solu-Medrol in the emergency department and famotidine. Patient was discharged with tapered prednisone and famotidine. Patient was advised to continue using Benadryl. Physical exam is reassuring. Strict return precautions were given. Patient voiced understanding regarding these return precautions. All patient questions were answered.    ____________________________________________  FINAL CLINICAL IMPRESSION(S) / ED DIAGNOSES  Final diagnoses:  Allergic reaction, initial encounter      NEW MEDICATIONS STARTED DURING THIS VISIT:  New Prescriptions   FAMOTIDINE (PEPCID) 40 MG TABLET    Take 1 tablet (40  mg total) by mouth daily.   PREDNISONE (STERAPRED UNI-PAK 21 TAB) 10 MG (21) TBPK TABLET    Take by mouth daily. Take 6 tablets the first day, take 5 tablets the second day, take 4 tablets the third day, take 3 tablets the fourth day, take 2 tablets the fifth day, take 1 tablet the sixth day.        This chart was dictated using voice recognition software/Dragon. Despite best efforts to proofread, errors can occur which can change the meaning. Any change was purely unintentional.    Gasper LloydWoods, Hargun Spurling M, PA-C 04/13/17 2151    Jene EveryKinner, Robert, MD 04/13/17 2152

## 2017-04-13 NOTE — ED Triage Notes (Signed)
Pt complains of hives to neck and face starting yesterday, pt denies dyspnea, pt is in no distress, pt denies any other symptoms

## 2017-06-30 ENCOUNTER — Encounter: Payer: Self-pay | Admitting: Physician Assistant

## 2017-06-30 ENCOUNTER — Ambulatory Visit (INDEPENDENT_AMBULATORY_CARE_PROVIDER_SITE_OTHER): Payer: Managed Care, Other (non HMO) | Admitting: Physician Assistant

## 2017-06-30 VITALS — BP 127/85 | HR 89 | Temp 98.5°F | Resp 18 | Ht 65.0 in | Wt 210.0 lb

## 2017-06-30 DIAGNOSIS — Z113 Encounter for screening for infections with a predominantly sexual mode of transmission: Secondary | ICD-10-CM | POA: Diagnosis not present

## 2017-06-30 DIAGNOSIS — N898 Other specified noninflammatory disorders of vagina: Secondary | ICD-10-CM

## 2017-06-30 DIAGNOSIS — Z7251 High risk heterosexual behavior: Secondary | ICD-10-CM

## 2017-06-30 LAB — POCT WET + KOH PREP
Trich by wet prep: ABSENT
YEAST BY KOH: ABSENT
Yeast by wet prep: ABSENT

## 2017-06-30 NOTE — Patient Instructions (Addendum)
Your wet prep does not look too concerning.  I will follow up with the results of the blood work.       IF you received an x-ray today, you will receive an invoice from [facility]. Please contact [facility] at [phone] with questions or concerns regarding your invoice.   IF you received labwork today, you will receive an invoice from [facility]. Please contact [facility] at [phone] with questions or concerns regarding your invoice.   Our billing staff will not be able to assist you with questions regarding bills from these companies.  You will be contacted with the lab results as soon as they are available. The fastest way to get your results is to activate your My Chart account. Instructions are located on the last page of this paperwork. If you have not heard from us regarding the results in 2 weeks, please contact this office.

## 2017-06-30 NOTE — Progress Notes (Signed)
PRIMARY CARE AT [facility] [address] [place] [address] [phone]  Date:  06/30/2017   Name:  Tasha Hancock   DOB:  [birth year]   MRN:  [id number]  PCP:  Default, Provider, MD    History of Present Illness:  Tasha Hancock is a 36 y.o. female patient who presents to PCP with  Chief Complaint  Patient presents with  . Exposure to STD    check for std     Patient is here for std check She is symptomatic, but has noticed some abnormal vaginal discharge after her boyfriend stated to her that he is sexually active with one other partner.  She has no dysuria, hematuria, or frequency.  No abnormal abdominal pain.  The discharge can have fishy odor, however inconsistent from day to day.  There are no active problems to display for this patient.   History reviewed. No pertinent past medical history.  History reviewed. No pertinent surgical history.  Social History  Substance Use Topics  . Smoking status: Current Every Day Smoker    Types: Cigarettes  . Smokeless tobacco: Never Used  . Alcohol use No    History reviewed. No pertinent family history.  Allergies  Allergen Reactions  . Shrimp [Shellfish Allergy] Hives    Medication list has been reviewed and updated.  Current Outpatient Prescriptions on File Prior to Visit  Medication Sig Dispense Refill  . diphenhydrAMINE (BENADRYL) 25 MG tablet Take 25 mg by mouth every 6 (six) hours as needed. Allergies.    . famotidine (PEPCID) 40 MG tablet Take 1 tablet (40 mg total) by mouth daily. 30 tablet 1  . hydrOXYzine (ATARAX/VISTARIL) 25 MG tablet Take 1 tablet (25 mg total) by mouth every 6 (six) hours as needed for itching. 20 tablet 0   No current facility-administered medications on file prior to visit.     ROS ROS otherwise unremarkable unless listed above.  Physical Examination: BP 127/85 (BP Location: Right Arm, Patient Position: Sitting, Cuff Size: Large)   Pulse 89   Temp 98.5 F (36.9 C) (Oral)   Resp 18   Ht 5'  5" (1.651 m)   Wt 210 lb (95.3 kg)   LMP  (Within Months) Comment: june 2018 last period  SpO2 98%   BMI 34.95 kg/m  Ideal Body Weight: Weight in (lb) to have BMI = 25: 149.9  Physical Exam  Constitutional: She is oriented to person, place, and time. She appears well-developed and well-nourished. No distress.  HENT:  Head: Normocephalic and atraumatic.  Right Ear: External ear normal.  Left Ear: External ear normal.  Eyes: Pupils are equal, round, and reactive to light. Conjunctivae and EOM are normal.  Cardiovascular: Normal rate.   Pulmonary/Chest: Effort normal. No respiratory distress.  Genitourinary: Pelvic exam was performed with patient supine. There is no rash on the right labia. There is no rash on the left labia. No bleeding in the vagina. Vaginal discharge found.  Neurological: She is alert and oriented to person, place, and time.  Skin: She is not diaphoretic.  Psychiatric: She has a normal mood and affect. Her behavior is normal.    Results for orders placed or performed in visit on 06/30/17  POCT Wet + KOH Prep  Result Value Ref Range   Yeast by KOH Absent Absent   Yeast by wet prep Absent Absent   WBC by wet prep Few Few   Clue Cells Wet Prep HPF POC Few (A) None   Trich by wet prep  Absent Absent   Bacteria Wet Prep HPF POC Few Few   Epithelial Cells By Principal FinancialWet Pref ([facility]) Few None, Few, Too numerous to count   RBC,UR,HPF,POC None None RBC/hpf     Assessment and Plan: Tasha SextonKia Hancock is a 36 y.o. female who is here today for cc of vaginal discharge, and std check Screening for STD (sexually transmitted disease) - Plan: HIV antibody, RPR, POCT Wet + KOH Prep, GC/Chlamydia Probe Amp, HSV(herpes simplex vrs) 1+2 ab-IgG, CANCELED: GC/Chlamydia Probe Amp  Unprotected sex - Plan: HIV antibody, RPR, POCT Wet + KOH Prep, GC/Chlamydia Probe Amp, HSV(herpes simplex vrs) 1+2 ab-IgG, CANCELED: GC/Chlamydia Probe Amp  Vaginal discharge - Plan: POCT Wet + KOH Prep, GC/Chlamydia  Probe Amp  Tasha PlattStephanie English, PA-C [facility] 8/19/20188:19 PM

## 2017-07-01 LAB — GC/CHLAMYDIA PROBE AMP
Chlamydia trachomatis, NAA: NEGATIVE
NEISSERIA GONORRHOEAE BY PCR: NEGATIVE

## 2017-07-01 LAB — HIV ANTIBODY (ROUTINE TESTING W REFLEX): HIV Screen 4th Generation wRfx: NONREACTIVE

## 2017-07-01 LAB — RPR: RPR: NONREACTIVE

## 2017-07-02 LAB — HSV(HERPES SIMPLEX VRS) I + II AB-IGG
HSV 1 Glycoprotein G Ab, IgG: 60.5 index — ABNORMAL HIGH (ref 0.00–0.90)
HSV 2 IgG, Type Spec: <0.91 index (ref 0.00–0.90)

## 2017-07-02 LAB — SPECIMEN STATUS REPORT

## 2018-02-16 ENCOUNTER — Encounter: Payer: Self-pay | Admitting: Physician Assistant

## 2018-09-03 ENCOUNTER — Emergency Department
Admission: EM | Admit: 2018-09-03 | Discharge: 2018-09-03 | Disposition: A | Discharge status: Home / Self Care | Payer: Managed Care, Other (non HMO) | Attending: Emergency Medicine | Admitting: Emergency Medicine

## 2018-09-03 ENCOUNTER — Other Ambulatory Visit: Payer: Self-pay

## 2018-09-03 DIAGNOSIS — F17210 Nicotine dependence, cigarettes, uncomplicated: Secondary | ICD-10-CM | POA: Insufficient documentation

## 2018-09-03 DIAGNOSIS — J029 Acute pharyngitis, unspecified: Secondary | ICD-10-CM | POA: Insufficient documentation

## 2018-09-03 LAB — GROUP A STREP BY PCR: GROUP A STREP BY PCR: NOT DETECTED

## 2018-09-03 MED ORDER — DEXAMETHASONE SODIUM PHOSPHATE 10 MG/ML IJ SOLN
10.0000 mg | Freq: Once | INTRAMUSCULAR | Status: AC
  Administered 2018-09-03: 10 mg via INTRAMUSCULAR
  Filled 2018-09-03: qty 1

## 2018-09-03 MED ORDER — AMOXICILLIN 250 MG/5ML PO SUSR
500.0000 mg | Freq: Once | ORAL | Status: AC
  Administered 2018-09-03: 500 mg via ORAL
  Filled 2018-09-03: qty 10

## 2018-09-03 MED ORDER — AMOXICILLIN 500 MG PO TABS: 500.0000 mg | ORAL_TABLET | Freq: Two times a day (BID) | ORAL | 0 refills | Status: AC

## 2018-09-03 NOTE — ED Triage Notes (Addendum)
Pt comes via POV from Urgent care with c/o sore throat. Pt states she woke up this am and her throat was hurting. Pt states her ears are also hurting as well.   Pt states she was just seen at Urgent Care and dx with ear infection and told to come here since her throat felt swollen.

## 2018-09-03 NOTE — ED Provider Notes (Signed)
[facility] Emergency Department Provider Note  ____________________________________________  Time seen: Approximately 10:31 PM  I have reviewed the triage vital signs and the nursing notes.   HISTORY  Chief Complaint Sore Throat    HPI Tasha Hancock is a 37 y.o. female presents to the emergency department with 10 out of 10 pharyngitis that started this morning.  Patient has headache and abdominal discomfort.  Patient has also had occasional headache.  No significant cough, congestion or rhinorrhea.  Patient has had fever at home.  She is tolerating her own secretions. Patient was seen at urgent care earlier today and tested negative for Group A Strep. She was referred to the ED as patient reported that her throat felt "swollen." No other alleviating measures have been attempted.    History reviewed. No pertinent past medical history.  There are no active problems to display for this patient.   History reviewed. No pertinent surgical history.  Prior to Admission medications   Medication Sig Start Date End Date Taking? Authorizing Provider  amoxicillin (AMOXIL) 500 MG tablet Take 1 tablet (500 mg total) by mouth 2 (two) times daily for 7 days. 09/03/18 09/10/18  Orvil Feil, PA-C  diphenhydrAMINE (BENADRYL) 25 MG tablet Take 25 mg by mouth every 6 (six) hours as needed. Allergies.    [provider]  famotidine (PEPCID) 40 MG tablet Take 1 tablet (40 mg total) by mouth daily. 04/13/17 04/18/17  Orvil Feil, PA-C  hydrOXYzine (ATARAX/VISTARIL) 25 MG tablet Take 1 tablet (25 mg total) by mouth every 6 (six) hours as needed for itching. 08/27/12   Quita Skye, MD    Allergies Shrimp [shellfish allergy]  No family history on file.  Social History Social History   Tobacco Use  . Smoking status: Current Every Day Smoker    Types: Cigarettes  . Smokeless tobacco: Never Used  Substance Use Topics  . Alcohol use: No  . Drug use: No      Review of Systems  Constitutional: Patient has fever.  Eyes: No visual changes. No discharge ENT: Patient has pharyngitis.  Cardiovascular: no chest pain. Respiratory: no cough. No SOB. Gastrointestinal: No abdominal pain.  No nausea, no vomiting.  No diarrhea.  No constipation. Genitourinary: Negative for dysuria. No hematuria Musculoskeletal: Negative for musculoskeletal pain. Skin: Negative for rash, abrasions, lacerations, ecchymosis. Neurological: Patient has headache, no focal weakness or numbness.   ____________________________________________   PHYSICAL EXAM:  VITAL SIGNS: ED Triage Vitals  Enc Vitals Group     BP 09/03/18 2033 133/76     Pulse Rate 09/03/18 2033 (!) 111     Resp 09/03/18 2033 19     Temp 09/03/18 2033 99.3 F (37.4 C)     Temp Source 09/03/18 2033 Oral     SpO2 09/03/18 2033 97 %     Weight 09/03/18 2032 199 lb (90.3 kg)     Height 09/03/18 2032 5\' 6"  (1.676 m)     Head Circumference --      Peak Flow --      Pain Score 09/03/18 2032 10     Pain Loc --      Pain Edu? --      Excl. in GC? --      Constitutional: Alert and oriented. Well appearing and in no acute distress. Eyes: Conjunctivae are normal. PERRL. EOMI. Head: Atraumatic. ENT:      Ears: TMs are pearly.      Nose: No congestion/rhinnorhea.  Mouth/Throat: Mucous membranes are moist.  Posterior pharynx is erythematous with some tonsillar exudate.  Tonsillar matter is very small.  Airway is patent.  Uvula is midline. Neck: No stridor.  No cervical spine tenderness to palpation. Hematological/Lymphatic/Immunilogical: Palpable cervical lymphadenopathy. Cardiovascular: Normal rate, regular rhythm. Normal S1 and S2.  Good peripheral circulation. Respiratory: Normal respiratory effort without tachypnea or retractions. Lungs CTAB. Good air entry to the bases with no decreased or absent breath sounds. Gastrointestinal: Bowel sounds 4 quadrants. Soft and nontender to palpation.  No guarding or rigidity. No palpable masses. No distention. No CVA tenderness. Musculoskeletal: Full range of motion to all extremities. No gross deformities appreciated. Neurologic:  Normal speech and language. No gross focal neurologic deficits are appreciated.  Skin:  Skin is warm, dry and intact. No rash noted. Psychiatric: Mood and affect are normal. Speech and behavior are normal. Patient exhibits appropriate insight and judgement.   ____________________________________________   LABS (all labs ordered are listed, but only abnormal results are displayed)  Labs Reviewed  GROUP A STREP BY PCR   ____________________________________________  EKG   ____________________________________________  RADIOLOGY   No results found.  ____________________________________________    PROCEDURES  Procedure(s) performed:    Procedures    Medications  dexamethasone (DECADRON) injection 10 mg (10 mg Intramuscular Given 09/03/18 2234)  amoxicillin (AMOXIL) 250 MG/5ML suspension 500 mg (500 mg Oral Given 09/03/18 2234)     ____________________________________________   INITIAL IMPRESSION / ASSESSMENT AND PLAN / ED COURSE  Pertinent labs & imaging results that were available during my care of the patient were reviewed by me and considered in my medical decision making (see chart for details).  Review of the  [place]RS was performed in accordance of the [place] prior to dispensing any controlled drugs.      Assessment and Plan:  Pharyngitis:  Patient presents to the emergency department with fever and pharyngitis.  Patient was referred from urgent care due to sensation of "throat swelling".  Patient was given Decadron in the emergency department and first dose of amoxicillin.  Patient was observed over the course of approximately 45 minutes and patient reported that her pharyngitis improved significantly after Decadron was administered.  Rest and hydration were encouraged.  Strict  return precautions were given.  Patient was advised to follow-up with primary care as needed.    ____________________________________________  FINAL CLINICAL IMPRESSION(S) / ED DIAGNOSES  Final diagnoses:  Pharyngitis, unspecified etiology      NEW MEDICATIONS STARTED DURING THIS VISIT:  ED Discharge Orders         Ordered    amoxicillin (AMOXIL) 500 MG tablet  2 times daily     09/03/18 2313              This chart was dictated using voice recognition software/Dragon. Despite best efforts to proofread, errors can occur which can change the meaning. Any change was purely unintentional.    Orvil Feil, PA-C 09/03/18 2344    Minna Antis, MD 09/06/18 [phone]

## 2019-04-20 ENCOUNTER — Encounter: Payer: Self-pay | Admitting: Podiatry

## 2019-04-20 ENCOUNTER — Ambulatory Visit (INDEPENDENT_AMBULATORY_CARE_PROVIDER_SITE_OTHER): Payer: BC Managed Care – PPO | Admitting: Podiatry

## 2019-04-20 ENCOUNTER — Other Ambulatory Visit: Payer: Self-pay

## 2019-04-20 VITALS — Temp 97.2°F

## 2019-04-20 DIAGNOSIS — L603 Nail dystrophy: Secondary | ICD-10-CM | POA: Diagnosis not present

## 2019-04-20 DIAGNOSIS — L600 Ingrowing nail: Secondary | ICD-10-CM | POA: Diagnosis not present

## 2019-04-20 DIAGNOSIS — B351 Tinea unguium: Secondary | ICD-10-CM | POA: Diagnosis not present

## 2019-04-20 MED ORDER — NEOMYCIN-POLYMYXIN-HC 1 % OT SOLN: OTIC | 1 refills | Status: DC

## 2019-04-20 NOTE — Addendum Note (Signed)
Addended by: Lottie Rater E on: 04/20/2019 07:06 PM   Modules accepted: Orders

## 2019-04-20 NOTE — Progress Notes (Signed)
  Subjective:  Patient ID: Tasha Hancock, female    DOB: September 04, 1981,  MRN: [id number] HPI Chief Complaint  Patient presents with  . Toe Pain    Hallux bilateral - both borders, tender x several months, toenails are think and discolored, PCP rx'd meds for toenail fungus-helped some but didn't take toenail pain away  . Skin Problem    Dorsal foot and ankle right - thick, callused, itchy rash x 2-3 years, tried multiple rx'd and OTC creams-no help  . New Patient (Initial Visit)    38 y.o. female presents with the above complaint.   ROS: Denies fever chills nausea vomiting muscle aches pains calf pain back pain chest pain shortness of breath.  No past medical history on file. No past surgical history on file.  Current Outpatient Medications:  .  NEOMYCIN-POLYMYXIN-HYDROCORTISONE (CORTISPORIN) 1 % SOLN OTIC solution, Apply 1-2 drops to toe BID after soaking, Disp: 10 mL, Rfl: 1  Allergies  Allergen Reactions  . Shrimp [Shellfish Allergy] Hives   Review of Systems Objective:   Vitals:   04/20/19 1814  Temp: (!) 97.2 F (36.2 C)    General: Well developed, nourished, in no acute distress, alert and oriented x3   Dermatological: Skin is warm, dry and supple bilateral. Nails x 10 are well maintained; remaining integument appears unremarkable at this time. There are no open sores, no preulcerative lesions, no rash or signs of infection present.  Probable lichen simplex chronicus with a large dry cracked macular type patch measuring 5 cm x 5 cm anterior ankle.  Sharp incurvated nail margins hallux bilateral no purulence no malodor ingrown nail.  Thick discolored nails.  Vascular: Dorsalis Pedis artery and Posterior Tibial artery pedal pulses are 2/4 bilateral with immedate capillary fill time. Pedal hair growth present. No varicosities and no lower extremity edema present bilateral.   Neruologic: Grossly intact via light touch bilateral. Vibratory intact via tuning fork bilateral.  Protective threshold with Semmes Wienstein monofilament intact to all pedal sites bilateral. Patellar and Achilles deep tendon reflexes 2+ bilateral. No Babinski or clonus noted bilateral.   Musculoskeletal: No gross boney pedal deformities bilateral. No pain, crepitus, or limitation noted with foot and ankle range of motion bilateral. Muscular strength 5/5 in all groups tested bilateral.  Gait: Unassisted, Nonantalgic.    Radiographs:  None taken  Assessment & Plan:   Assessment: Ingrown toenail hallux bilateral cannot rule out nail dystrophy for thickness and pain as well.  Lichen simplex chronicus right ankle anterior  Plan: Chemical matrixectomy was performed today after local anesthetic was administered to the hallux tibial and fibular borders bilaterally.  The portions of nail removed were sent for pathologic evaluation.  She tolerated the procedure well without complications Cortisporin Otic was prescribed she will soak twice a day and apply Cortisporin otic twice a day.  We will make a referral to dermatology for the lichen simplex chronicus.  I will follow-up with her in 2 weeks and reevaluate.      T. [place], [place]

## 2019-04-20 NOTE — Patient Instructions (Signed)

## 2019-04-29 ENCOUNTER — Other Ambulatory Visit: Payer: Self-pay

## 2019-04-29 DIAGNOSIS — L280 Lichen simplex chronicus: Secondary | ICD-10-CM

## 2019-05-03 DIAGNOSIS — Z23 Encounter for immunization: Secondary | ICD-10-CM | POA: Diagnosis not present

## 2019-05-04 ENCOUNTER — Encounter: Payer: Self-pay | Admitting: Podiatry

## 2019-05-04 ENCOUNTER — Other Ambulatory Visit: Payer: Self-pay

## 2019-05-04 ENCOUNTER — Ambulatory Visit (INDEPENDENT_AMBULATORY_CARE_PROVIDER_SITE_OTHER): Payer: BC Managed Care – PPO | Admitting: Podiatry

## 2019-05-04 VITALS — Temp 97.3°F

## 2019-05-04 DIAGNOSIS — Z9889 Other specified postprocedural states: Secondary | ICD-10-CM

## 2019-05-04 DIAGNOSIS — L600 Ingrowing nail: Secondary | ICD-10-CM

## 2019-05-04 NOTE — Progress Notes (Signed)
She presents today for follow-up of her matrixectomy's and nail biopsy.  Objective: Vital signs stable she alert oriented x3 toenails appear to be a bit macerated but states that she has been soaking them readily.  Assessment: Well-healing surgical toes.  Plan: Continue soak Epson salts and warm water cover during the day leave open at bedtime follow-up with her in 2 to 3 weeks for nail pathology.

## 2019-05-04 NOTE — Patient Instructions (Signed)

## 2019-05-05 ENCOUNTER — Telehealth: Payer: Self-pay

## 2019-05-05 NOTE — Telephone Encounter (Signed)
-----   Message from Ashley E Prevette, PMAC sent at 04/20/2019  7:06 PM EDT ----- Regarding: Dermatology Referral to Dr. Eisenstein - dermatology  Skin lesion/rash dorsal foot and anterior ankle right   

## 2019-05-05 NOTE — Telephone Encounter (Signed)
Left message for [facility] dermatology to call back and schedule patient appt.

## 2019-05-11 DIAGNOSIS — Z6836 Body mass index (BMI) 36.0-36.9, adult: Secondary | ICD-10-CM | POA: Diagnosis not present

## 2019-05-11 DIAGNOSIS — Z01419 Encounter for gynecological examination (general) (routine) without abnormal findings: Secondary | ICD-10-CM | POA: Diagnosis not present

## 2019-05-11 DIAGNOSIS — Z30431 Encounter for routine checking of intrauterine contraceptive device: Secondary | ICD-10-CM | POA: Diagnosis not present

## 2019-05-11 DIAGNOSIS — N939 Abnormal uterine and vaginal bleeding, unspecified: Secondary | ICD-10-CM | POA: Diagnosis not present

## 2019-05-11 DIAGNOSIS — Z113 Encounter for screening for infections with a predominantly sexual mode of transmission: Secondary | ICD-10-CM | POA: Diagnosis not present

## 2019-05-12 DIAGNOSIS — N390 Urinary tract infection, site not specified: Secondary | ICD-10-CM | POA: Diagnosis not present

## 2019-05-12 DIAGNOSIS — L280 Lichen simplex chronicus: Secondary | ICD-10-CM | POA: Diagnosis not present

## 2019-05-16 DIAGNOSIS — N939 Abnormal uterine and vaginal bleeding, unspecified: Secondary | ICD-10-CM | POA: Diagnosis not present

## 2019-05-16 DIAGNOSIS — N840 Polyp of corpus uteri: Secondary | ICD-10-CM | POA: Diagnosis not present

## 2019-05-24 ENCOUNTER — Telehealth: Payer: Self-pay

## 2019-05-24 NOTE — Telephone Encounter (Signed)
-----   Message from Norman sent at 04/20/2019  7:06 PM EDT ----- Regarding: Dermatology Referral to Dr. Darrick Huntsman - dermatology  Skin lesion/rash dorsal foot and anterior ankle right

## 2019-05-24 NOTE — Telephone Encounter (Signed)
Referral has been faxed to [facility] dermatology.  Patient has been notified and will call to schedule her appt

## 2019-06-01 ENCOUNTER — Ambulatory Visit: Payer: BC Managed Care – PPO | Admitting: Podiatry

## 2019-06-06 ENCOUNTER — Ambulatory Visit (INDEPENDENT_AMBULATORY_CARE_PROVIDER_SITE_OTHER): Payer: BC Managed Care – PPO | Admitting: Podiatry

## 2019-06-06 ENCOUNTER — Encounter: Payer: Self-pay | Admitting: Podiatry

## 2019-06-06 ENCOUNTER — Other Ambulatory Visit: Payer: Self-pay

## 2019-06-06 VITALS — Temp 97.8°F

## 2019-06-06 DIAGNOSIS — L603 Nail dystrophy: Secondary | ICD-10-CM

## 2019-06-06 DIAGNOSIS — Z79899 Other long term (current) drug therapy: Secondary | ICD-10-CM

## 2019-06-06 MED ORDER — ITRACONAZOLE 100 MG PO CAPS: 100.0000 mg | ORAL_CAPSULE | Freq: Every day | ORAL | 0 refills | Status: AC

## 2019-06-06 NOTE — Progress Notes (Signed)
She presents today for follow-up of her matrixectomy's and her pathology report.  States that the matricectomy's are doing great she is very happy with the outcome they are no longer painful.  She is concerned about the onychomycosis.  Objective: Pathology demonstrates onychomycosis but with a yeast today.  Her toenails have gone on to heal uneventfully.  Assessment: Well-healing surgical toes hallux bilateral and onychomycosis with yeast infection.  Plan: Started her on itraconazole recommended by pathology particularly for these 2 organisms.  Were going to request a liver profile.  Should this come back abnormal I will notify her immediately.  She will take itraconazole 100 mg twice daily.

## 2019-06-06 NOTE — Patient Instructions (Signed)
Itraconazole capsules and tablets °What is this medicine? °ITRACONAZOLE (i tra KO na zole) is an antifungal medicine. It is used to treat certain kinds of fungal or yeast infections. °This medicine may be used for other purposes; ask your health care provider or pharmacist if you have questions. °COMMON BRAND NAME(S): ONMEL, Sporanox, TOLSURA °What should I tell my health care provider before I take this medicine? °They need to know if you have any of these conditions: °· heart disease °· history of irregular heartbeat °· immune system problems °· kidney disease °· liver disease °· lung or breathing disease °· an unusual or allergic reaction to itraconazole, or other antifungal medicines, foods, dyes or preservatives °· pregnant or trying to get pregnant °· breast-feeding °How should I use this medicine? °Take this medicine by mouth with a full glass of water. Follow the directions on the prescription label. Take this medicine with food. Avoid taking antacids, H2-blockers, or proton pump inhibitors within 2 hours of taking this medicine. It is best to separate these medicines by 2 hours. Take your medicine at regular intervals. Do not take your medicine more often than directed. Take all of your medicine as directed even if you think you are better. Do not skip doses or stop your medicine early. °Talk to your pediatrician regarding the use of this medicine in children. Special care may be needed. °Overdosage: If you think you have taken too much of this medicine contact a poison control center or emergency room at once. °NOTE: This medicine is only for you. Do not share this medicine with others. °What if I miss a dose? °If you miss a dose, take it as soon as you can. If it is almost time for your next dose, take only that dose. Do not take double or extra doses. °What may interact with this medicine? °Do not take this medicine with any of the following medications: °· alfuzosin °· alprazolam °· avanafil °· certain  medicines for blood pressure like felodipine, nisoldipine °· certain medicines for cholesterol like cerivastatin, lovastatin, simvastatin, lomitapide °· certain medicines for the heart like disopyramide, dofetilide, dronedarone, eplerenone, ivabradine, quinidine, ranolazine °· cisapride °· colchicine (if you have liver or kidney problems) °· conivaptan °· ergot alkaloids like dihydroergotamine, ergonovine, ergotamine, methylergonovine °· irinotecan °· isavuconazole °· lurasidone °· methadone °· midazolam °· naloxegol °· nevirapine °· other medicines that prolong the QT interval (cause an abnormal heart rhythm) °· pimozide °· red yeast rice °· sirolimus °· thioridazine °· ticagrelor °· triazolam °This medicine may also interact with the following medications: °· aliskiren °· amlodipine °· antacids °· antiviral medicines for HIV or AIDS °· aprepitant °· atorvastatin °· buprenorphine °· certain antibiotics like ciprofloxacin, clarithromycin, erythromycin °· certain medicines for bladder problems like fesoterodine, solifenacin, tolterodine °· certain medicines for cancer like axitinib, bortezomib, busulfan, dabrafenib, dasatinib, docetaxel, erlotinib, gefitinib, ibrutinib, imatinib, ixabepilone, lapatinib, nilotinib, ponatinib, sunitinib, trabectedin, trimetrexate, vinca alkaloids °· certain medicines for depression, anxiety, or psychotic disturbances like aripiprazole, buspirone, diazepam, haloperidol, perospirone, quetiapine, risperidone °· certain medicines for erectile dysfunction like vardenafil, sildenafil, tadalafil °· certain medicines for pain like alfentanil, fentanyl, oxycodone, sufentanil °· certain medicines for stomach problems like cimetidine, famotidine, omeprazole, lansoprazole °· certain medicines that treat or prevent blood clots like warfarin, dabigatran, rivaroxaban °· certain medicines for seizures like carbamazepine, phenytoin °· certain medicines for tuberculosis like isoniazid, INH, rifabutin,  rifampin, rifapentine °· cilostazol °· cinacalcet °· cyclosporine °· digoxin °· eletriptan °· everolimus °· halofantrine °· isradipine °· meloxicam °· nadolol °·   nifedipine °· other medicines for fungal infections °· praziquantel °· ramelteon °· repaglinide °· salmeterol °· saxagliptin °· steroid medicines like budesonide, ciclesonide, dexamethasone, fluticasone, methylprednisolone °· tacrolimus °· tamsulosin °· tolvaptan °· verapamil °· ziprasidone °This list may not describe all possible interactions. Give your health care provider a list of all the medicines, herbs, non-prescription drugs, or dietary supplements you use. Also tell them if you smoke, drink alcohol, or use illegal drugs. Some items may interact with your medicine. °What should I watch for while using this medicine? °Tell your doctor or healthcare professional if your symptoms do not start to get better or if they get worse. °You may get drowsy or dizzy. Do not drive, use machinery, or do anything that needs mental alertness until you know how the medicine affects you. Do not stand or sit up quickly, especially if you are an older patient. This reduces the risk of dizzy or fainting spells. °What side effects may I notice from receiving this medicine? °Side effects that you should report to your doctor or health care professional as soon as possible: °· allergic reactions like skin rash, itching or hives, swelling of the face, lips, or tongue °· changes in hearing °· pain, tingling, numbness in the hands or feet °· signs and symptoms of heart failure like breathing problems; fast, irregular heartbeat; sudden weight gain; swelling of the ankles, feet; unusually weak or tired °· signs and symptoms of liver injury like dark yellow or brown urine; general ill feeling or flu-like symptoms; light-colored stools; loss of appetite; right upper belly pain; yellowing of the eyes or skin °Side effects that usually do not require medical attention (report to  your doctor or health care professional if they continue or are bothersome): °· blurred vision °· diarrhea °· dizziness °· headache °· nausea, vomiting °This list may not describe all possible side effects. Call your doctor for medical advice about side effects. You may report side effects to FDA at 1-800-FDA-1088. °Where should I keep my medicine? °Keep out of the reach of children. °Store at room temperature between 15 and 25 degrees C (59 and 77 degrees F). Protect from light and moisture. Throw away any unused medicine after the expiration date. °NOTE: This sheet is a summary. It may not cover all possible information. If you have questions about this medicine, talk to your doctor, pharmacist, or health care provider. °© 2020 [employer] (2018-10-25 13:38:38) ° ° °

## 2019-07-06 ENCOUNTER — Ambulatory Visit: Payer: BC Managed Care – PPO | Admitting: Podiatry

## 2019-07-14 DIAGNOSIS — N858 Other specified noninflammatory disorders of uterus: Secondary | ICD-10-CM | POA: Diagnosis not present

## 2019-07-14 DIAGNOSIS — N939 Abnormal uterine and vaginal bleeding, unspecified: Secondary | ICD-10-CM | POA: Diagnosis not present

## 2019-07-14 DIAGNOSIS — Z30433 Encounter for removal and reinsertion of intrauterine contraceptive device: Secondary | ICD-10-CM | POA: Diagnosis not present

## 2019-07-14 DIAGNOSIS — N840 Polyp of corpus uteri: Secondary | ICD-10-CM | POA: Diagnosis not present

## 2019-07-21 DIAGNOSIS — Z8349 Family history of other endocrine, nutritional and metabolic diseases: Secondary | ICD-10-CM | POA: Diagnosis not present

## 2019-07-21 DIAGNOSIS — Z13220 Encounter for screening for lipoid disorders: Secondary | ICD-10-CM | POA: Diagnosis not present

## 2019-07-21 DIAGNOSIS — Z23 Encounter for immunization: Secondary | ICD-10-CM | POA: Diagnosis not present

## 2019-07-21 DIAGNOSIS — R7303 Prediabetes: Secondary | ICD-10-CM | POA: Diagnosis not present

## 2019-07-21 DIAGNOSIS — Z0000 Encounter for general adult medical examination without abnormal findings: Secondary | ICD-10-CM | POA: Diagnosis not present

## 2019-11-18 HISTORY — PX: ABDOMINOPLASTY: SUR9

## 2020-01-07 ENCOUNTER — Encounter (HOSPITAL_COMMUNITY): Payer: Self-pay

## 2020-01-07 ENCOUNTER — Other Ambulatory Visit: Payer: Self-pay

## 2020-01-07 ENCOUNTER — Ambulatory Visit (HOSPITAL_COMMUNITY)
Admission: EM | Admit: 2020-01-07 | Discharge: 2020-01-07 | Disposition: A | Discharge status: Home / Self Care | Payer: BC Managed Care – PPO

## 2020-01-07 DIAGNOSIS — L509 Urticaria, unspecified: Secondary | ICD-10-CM

## 2020-01-07 MED ORDER — HYDROXYZINE HCL 25 MG PO TABS: 25.0000 mg | ORAL_TABLET | Freq: Four times a day (QID) | ORAL | 0 refills | Status: AC | PRN

## 2020-01-07 NOTE — ED Provider Notes (Addendum)
[facility]    CSN: [id number] Arrival date & time: 01/07/20  1640      History   Chief Complaint Chief Complaint  Patient presents with  . Urticaria    HPI Tasha Hancock is a 39 y.o. female.   Tasha Hancock presents with complaints of itching rash to trunk. Started at least a week ago. She had a tummy tuck 4 weeks ago in [place]. Has been wearing an abdominal shaper/ compression per instruction from her surgeon. She was instructed that she could remove it last weekend due to itching, which minimally helped. Itching worse at night. Notes raised area to skin which wax and wane. Has been taking benadryl, topical hydrocortisone and even ice, which haven't helped. No other rash elsewhere. No known exposure to shrimp as she is allergic. Not painful. Denies concerns about incision itself.     ROS per HPI, negative if not otherwise mentioned.      History reviewed. No pertinent past medical history.  There are no problems to display for this patient.   Past Surgical History:  Procedure Laterality Date  . ABDOMINOPLASTY  11/2019    OB History   No obstetric history on file.      Home Medications    Prior to Admission medications   Medication Sig Start Date End Date Taking? Authorizing Provider  betamethasone dipropionate (DIPROLENE) 0.05 % cream  05/12/19   [provider]  cyclobenzaprine (FLEXERIL) 10 MG tablet Take 10 mg by mouth every 8 (eight) hours as needed. 11/21/19   [provider]  fluconazole (DIFLUCAN) 150 MG tablet SMARTSIG:1 Tablet(s) By Mouth As Needed 12/16/19   [provider]  hydrOXYzine (ATARAX/VISTARIL) 25 MG tablet Take 1 tablet (25 mg total) by mouth every 6 (six) hours as needed for itching. 01/07/20   Georgetta Haber, NP  itraconazole (SPORANOX) 100 MG capsule Take 1 capsule (100 mg total) by mouth daily. 06/06/19   Hyatt, Max T, DPM  ondansetron (ZOFRAN-ODT) 8 MG disintegrating tablet PLEASE SEE ATTACHED FOR  DETAILED DIRECTIONS 11/21/19   [provider]  oxyCODONE (OXY IR/ROXICODONE) 5 MG immediate release tablet PLEASE SEE ATTACHED FOR DETAILED DIRECTIONS 11/21/19   [provider]  sulfamethoxazole-trimethoprim (BACTRIM DS) 800-160 MG tablet Take 1 tablet by mouth 2 (two) times daily. 11/21/19   [provider]    Family History Family History  Problem Relation Age of Onset  . Healthy Mother   . Healthy Father     Social History Social History   Tobacco Use  . Smoking status: Current Every Day Smoker    Types: Cigarettes  . Smokeless tobacco: Never Used  Substance Use Topics  . Alcohol use: No  . Drug use: No     Allergies   Shrimp [shellfish allergy]   Review of Systems Review of Systems   Physical Exam Triage Vital Signs ED Triage Vitals  Enc Vitals Group     BP 01/07/20 1809 115/83     Pulse Rate 01/07/20 1809 100     Resp 01/07/20 1809 18     Temp 01/07/20 1809 98.6 F (37 C)     Temp Source 01/07/20 1809 Oral     SpO2 01/07/20 1809 99 %     Weight --      Height --      Head Circumference --      Peak Flow --      Pain Score 01/07/20 1806 0     Pain Loc --  Pain Edu? --      Excl. in [place]? --    No data found.  Updated Vital Signs BP 115/83 (BP Location: Right Arm)   Pulse 100   Temp 98.6 F (37 C) (Oral)   Resp 18   LMP 12/12/2019   SpO2 99%    Physical Exam Constitutional:      General: She is not in acute distress.    Appearance: She is well-developed.  Cardiovascular:     Rate and Rhythm: Normal rate.  Pulmonary:     Effort: Pulmonary effort is normal.  Skin:    General: Skin is warm and dry.     Comments: Abdomen with very fine skin toned papular rash, minimal redness, no pustules, no hives, no scabbing, noted to be within parameters of compression garment; low abdominal incision briefly assessed under dressings, without redness or significant drainage, noted a few small areas of dehiscence without indication  of infection present   Neurological:     Mental Status: She is alert and oriented to person, place, and time.      UC Treatments / Results  Labs (all labs ordered are listed, but only abnormal results are displayed) Labs Reviewed - No data to display  EKG   Radiology No results found.  Procedures Procedures (including critical care time)  Medications Ordered in UC Medications - No data to display  Initial Impression / Assessment and Plan / UC Course  I have reviewed the triage vital signs and the nursing notes.  Pertinent labs & imaging results that were available during my care of the patient were reviewed by me and considered in my medical decision making (see chart for details).     Very fine rash and irritation to abdomen s/p abdominal surgery and regular use of compression garment to truck. Appears more consistent with heat rash than allergic response at this time. Waxes and wanes, more itchy at night. Non tender. Without complaints in regards to incision. Hydroxyzine provided to try to aid with itching. Encouraged to continue to work with surgeon for appropriate use and discontinuation of compression as it appears that this is likely contributing as well. Cool compresses recommended. Patient does enquire about the use of epinephrine, education provided that there is no indication of anaphylaxes, and this does not necessarily even look allergic in nature at this time. Ambulatory out of clinic without difficulty.    Final Clinical Impressions(s) / UC Diagnoses   Final diagnoses:  Urticaria     Discharge Instructions     This appears likely related to your use of compression.  Continue working with Engineer, production for recommendations of use of compression.  Cool compresses to help with itching.  May try the medication I have prescribed to help with your itching. May cause drowsiness. Please do not take if driving or drinking alcohol.     ED Prescriptions    Medication  Sig Dispense Auth. Provider   hydrOXYzine (ATARAX/VISTARIL) 25 MG tablet Take 1 tablet (25 mg total) by mouth every 6 (six) hours as needed for itching. 12 tablet Zigmund Gottron, NP     PDMP not reviewed this encounter.   Zigmund Gottron, NP 01/08/20 0951    Zigmund Gottron, NP 01/08/20 [phone]

## 2020-01-07 NOTE — ED Notes (Signed)
Evaluated pt while in waiting room. Denies difficulty breathing, no facial/mouth or lip edema. States hives since yesterday. Instructed to wait in waiting room, if develops SOB or facial edema, to notify staff.

## 2020-01-07 NOTE — Discharge Instructions (Signed)
This appears likely related to your use of compression.  Continue working with Designer, industrial/product for recommendations of use of compression.  Cool compresses to help with itching.  May try the medication I have prescribed to help with your itching. May cause drowsiness. Please do not take if driving or drinking alcohol.

## 2020-01-07 NOTE — ED Triage Notes (Signed)
Pt states she developed rash to abd and back approx 2 days with itching. Pt had elective cosmetic surgery (tummy tuck) approx 4 weeks ago and is wearing a abd binder/garment for wound healing and abdominal "board". Pt states she took her abd "board" off as per her plastic surgeon, but still has rash/itching. Denies SOB, facial swelling. States also had rash to face.  Took Benadryl and topical hydrocortisone, aloe with some improvement.

## 2020-02-02 DIAGNOSIS — L280 Lichen simplex chronicus: Secondary | ICD-10-CM | POA: Diagnosis not present

## 2020-02-09 ENCOUNTER — Ambulatory Visit: Payer: BC Managed Care – PPO | Attending: Internal Medicine

## 2020-02-09 DIAGNOSIS — Z23 Encounter for immunization: Secondary | ICD-10-CM

## 2020-02-09 NOTE — Progress Notes (Signed)
   Covid-19 Vaccination Clinic  Name:  Tasha Hancock    MRN: [id number] DOB: June 11, 1981  02/09/2020  [name] was observed post Covid-19 immunization for 30 minutes based on pre-vaccination screening without incident. She was provided with Vaccine Information Sheet and instruction to access the V-Safe system.   [name] was instructed to call 911 with any severe reactions post vaccine: Marland Kitchen Difficulty breathing  . Swelling of face and throat  . A fast heartbeat  . A bad rash all over body  . Dizziness and weakness   Immunizations Administered    Name Date Dose VIS Date Route   Pfizer COVID-19 Vaccine 02/09/2020 11:17 AM 0.3 mL 10/28/2019 Intramuscular   Manufacturer: [employer], [employer]   Lot: [id number]   NDC: [address]-6

## 2020-03-05 ENCOUNTER — Ambulatory Visit: Payer: BC Managed Care – PPO | Attending: Internal Medicine

## 2020-03-05 DIAGNOSIS — Z23 Encounter for immunization: Secondary | ICD-10-CM

## 2020-03-05 NOTE — Progress Notes (Signed)
   Covid-19 Vaccination Clinic  Name:  Verble Styron    MRN: [id number] DOB: 1981/06/07  03/05/2020  Ms. Klimas was observed post Covid-19 immunization for 15 minutes without incident. She was provided with Vaccine Information Sheet and instruction to access the V-Safe system.   Ms. Topp was instructed to call 911 with any severe reactions post vaccine: Marland Kitchen Difficulty breathing  . Swelling of face and throat  . A fast heartbeat  . A bad rash all over body  . Dizziness and weakness   Immunizations Administered    Name Date Dose VIS Date Route   Pfizer COVID-19 Vaccine 03/05/2020 10:45 AM 0.3 mL 01/11/2019 Intramuscular   Manufacturer: [employer], [employer]   Lot: [id number]   NDC: [address]-5

## 2020-12-27 DIAGNOSIS — Z01419 Encounter for gynecological examination (general) (routine) without abnormal findings: Secondary | ICD-10-CM | POA: Diagnosis not present

## 2020-12-27 DIAGNOSIS — Z6836 Body mass index (BMI) 36.0-36.9, adult: Secondary | ICD-10-CM | POA: Diagnosis not present

## 2020-12-27 DIAGNOSIS — Z113 Encounter for screening for infections with a predominantly sexual mode of transmission: Secondary | ICD-10-CM | POA: Diagnosis not present

## 2020-12-28 DIAGNOSIS — R319 Hematuria, unspecified: Secondary | ICD-10-CM | POA: Diagnosis not present
